# Patient Record
Sex: Male | Born: 1950 | Race: White | Hispanic: No | Smoking: Former smoker
Health system: Southern US, Community
[De-identification: ages and names within clinical notes are randomized; demographics above are authoritative.]

## PROBLEM LIST (undated history)

## (undated) DIAGNOSIS — I10 Essential (primary) hypertension: Secondary | ICD-10-CM

## (undated) DIAGNOSIS — I4891 Unspecified atrial fibrillation: Secondary | ICD-10-CM

## (undated) HISTORY — DX: Unspecified atrial fibrillation: I48.91

## (undated) HISTORY — DX: Essential (primary) hypertension: I10

## (undated) HISTORY — PX: WRIST SURGERY: SHX841

---

## 2009-11-08 ENCOUNTER — Ambulatory Visit: Payer: Self-pay | Admitting: Gastroenterology

## 2019-02-11 DIAGNOSIS — Z0000 Encounter for general adult medical examination without abnormal findings: Secondary | ICD-10-CM | POA: Insufficient documentation

## 2019-06-17 DIAGNOSIS — I420 Dilated cardiomyopathy: Secondary | ICD-10-CM | POA: Insufficient documentation

## 2019-10-19 ENCOUNTER — Telehealth (INDEPENDENT_AMBULATORY_CARE_PROVIDER_SITE_OTHER): Payer: Self-pay

## 2019-10-19 ENCOUNTER — Encounter (INDEPENDENT_AMBULATORY_CARE_PROVIDER_SITE_OTHER): Payer: Self-pay | Admitting: Vascular Surgery

## 2019-10-19 ENCOUNTER — Ambulatory Visit (INDEPENDENT_AMBULATORY_CARE_PROVIDER_SITE_OTHER): Payer: Medicare HMO | Admitting: Vascular Surgery

## 2019-10-19 ENCOUNTER — Encounter (INDEPENDENT_AMBULATORY_CARE_PROVIDER_SITE_OTHER): Payer: Self-pay

## 2019-10-19 ENCOUNTER — Other Ambulatory Visit: Payer: Self-pay

## 2019-10-19 DIAGNOSIS — I10 Essential (primary) hypertension: Secondary | ICD-10-CM

## 2019-10-19 DIAGNOSIS — I4891 Unspecified atrial fibrillation: Secondary | ICD-10-CM

## 2019-10-19 DIAGNOSIS — I70261 Atherosclerosis of native arteries of extremities with gangrene, right leg: Secondary | ICD-10-CM

## 2019-10-19 DIAGNOSIS — I70269 Atherosclerosis of native arteries of extremities with gangrene, unspecified extremity: Secondary | ICD-10-CM | POA: Insufficient documentation

## 2019-10-19 NOTE — Assessment & Plan Note (Signed)
Remains on anticoagulation.  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  Angiogram scheduled for the near future.

## 2019-10-19 NOTE — Patient Instructions (Signed)
Peripheral Vascular Disease  Peripheral vascular disease (PVD) is a disease of the blood vessels that are not part of your heart and brain. A simple term for PVD is poor circulation. In most cases, PVD narrows the blood vessels that carry blood from your heart to the rest of your body. This can reduce the supply of blood to your arms, legs, and internal organs, like your stomach or kidneys. However, PVD most often affects a person's lower legs and feet. Without treatment, PVD tends to get worse. PVD can also lead to acute ischemic limb. This is when an arm or leg suddenly cannot get enough blood. This is a medical emergency. Follow these instructions at home: Lifestyle  Do not use any products that contain nicotine or tobacco, such as cigarettes and e-cigarettes. If you need help quitting, ask your doctor.  Lose weight if you are overweight. Or, stay at a healthy weight as told by your doctor.  Eat a diet that is low in fat and cholesterol. If you need help, ask your doctor.  Exercise regularly. Ask your doctor for activities that are right for you. General instructions  Take over-the-counter and prescription medicines only as told by your doctor.  Take good care of your feet: ? Wear comfortable shoes that fit well. ? Check your feet often for any cuts or sores.  Keep all follow-up visits as told by your doctor This is important. Contact a doctor if:  You have cramps in your legs when you walk.  You have leg pain when you are at rest.  You have coldness in a leg or foot.  Your skin changes.  You are unable to get or have an erection (erectile dysfunction).  You have cuts or sores on your feet that do not heal. Get help right away if:  Your arm or leg turns cold, numb, and blue.  Your arms or legs become red, warm, swollen, painful, or numb.  You have chest pain.  You have trouble breathing.  You suddenly have weakness in your face, arm, or leg.  You become very  confused or you cannot speak.  You suddenly have a very bad headache.  You suddenly cannot see. Summary  Peripheral vascular disease (PVD) is a disease of the blood vessels.  A simple term for PVD is poor circulation. Without treatment, PVD tends to get worse.  Treatment may include exercise, low fat and low cholesterol diet, and quitting smoking. This information is not intended to replace advice given to you by your health care provider. Make sure you discuss any questions you have with your health care provider. Document Released: 01/15/2010 Document Revised: 10/03/2017 Document Reviewed: 11/28/2016 Elsevier Patient Education  2020 [employer]  

## 2019-10-19 NOTE — Telephone Encounter (Signed)
Patient was seen today and is now scheduled for a right leg angio with Dr. Lucky Cowboy on 10/25/2019 with a 9:00 am arrival time to the [facility]. Patient will do covid testing on 10/21/2019 between 12:30-2:30 pm at the [facility]. Pre-procedure instructions were discussed and a copy of instructions given to the patient.

## 2019-10-19 NOTE — Progress Notes (Signed)
Patient ID: Darrell Collier, male   DOB: 11/05/1950, 68 y.o.   MRN: [id number]  Chief Complaint  Patient presents with  . New Patient (Initial Visit)    ref Darrell Collier ischemic rt 2nd toe    HPI Darrell Collier is a 68 y.o. male.  I am asked to see the patient by Dr. Hyacinth Collier for evaluation of a gangrenous right second toe.  The patient began to notice discoloration of this toe over the past couple of weeks.  It has been painful.  There is no trauma or injury to the toe.  He thought maybe his shoes were a little tight initially, but even with loosened shoes the area has gotten worse.  No previous ulceration or infection to either lower extremity.  He does describe some claudication symptoms in the leg.  No clear inciting event or causative factor.  Nothing is made it better.  No fevers or chills.  No signs of systemic infection.  He does have history of atrial fibrillation and dilated cardiomyopathy and has been on anticoagulation which he continues.     Past Medical History:  Diagnosis Date  . A-fib (HCC)   . Hypertension      Family History  Problem Relation Age of Onset  . COPD Mother   . Breast cancer Mother   . Macular degeneration Father   No bleeding or clotting disorders   Social History   Tobacco Use  . Smoking status: Former Games developer  . Smokeless tobacco: Never Used  Substance Use Topics  . Alcohol use: Yes    Comment: ocassionally  . Drug use: Never    No Known Allergies  Current Outpatient Medications  Medication Sig Dispense Refill  . apixaban (ELIQUIS) 5 MG TABS tablet Take by mouth.    . diphenhydrAMINE (BENADRYL) 50 MG tablet Take by mouth.    . metoprolol succinate (TOPROL-XL) 50 MG 24 hr tablet Take by mouth.    . olmesartan-hydrochlorothiazide (BENICAR HCT) 20-12.5 MG tablet Take by mouth.     No current facility-administered medications for this visit.      REVIEW OF SYSTEMS (Negative unless checked)  Constitutional: [] Weight loss  [] Fever   [] Chills Cardiac: [] Chest pain   [] Chest pressure   [] Palpitations   [] Shortness of breath when laying flat   [] Shortness of breath at rest   [] Shortness of breath with exertion. Vascular:  [] Pain in legs with walking   [] Pain in legs at rest   [] Pain in legs when laying flat   [] Claudication   [x] Pain in feet when walking  [x] Pain in feet at rest  [] Pain in feet when laying flat   [] History of DVT   [] Phlebitis   [] Swelling in legs   [] Varicose veins   [x] Non-healing ulcers Pulmonary:   [] Uses home oxygen   [] Productive cough   [] Hemoptysis   [] Wheeze  [] COPD   [] Asthma Neurologic:  [] Dizziness  [] Blackouts   [] Seizures   [] History of stroke   [] History of TIA  [] Aphasia   [] Temporary blindness   [] Dysphagia   [] Weakness or numbness in arms   [] Weakness or numbness in legs Musculoskeletal:  [x] Arthritis   [] Joint swelling   [x] Joint pain   [] Low back pain Hematologic:  [] Easy bruising  [] Easy bleeding   [] Hypercoagulable state   [] Anemic  [] Hepatitis Gastrointestinal:  [] Blood in stool   [] Vomiting blood  [] Gastroesophageal reflux/heartburn   [] Abdominal pain Genitourinary:  [] Chronic kidney disease   [] Difficult urination  [] Frequent urination  [] Burning with  urination   [] Hematuria Skin:  [] Rashes   [x] Ulcers   [x] Wounds Psychological:  [] History of anxiety   []  History of major depression.    Physical Exam BP 102/74 (BP Location: Right Arm)   Pulse 92   Resp 16   Wt 210 lb (95.3 kg)  Gen:  WD/WN, NAD.  Appears older than stated age Head: [place]/AT, No temporalis wasting Ear/Nose/Throat: Hearing grossly intact, nares w/o erythema or drainage, oropharynx w/o Erythema/Exudate Eyes: Conjunctiva clear, sclera non-icteric  Neck: trachea midline.  No JVD.  Pulmonary:  Good air movement, respirations not labored, no use of accessory muscles  Cardiac: Irregular Vascular:  Vessel Right Left  Radial Palpable Palpable                          DP  trace  1+  PT  not palpable  not palpable    Gastrointestinal:. No masses, surgical incisions, or scars. Musculoskeletal: M/S 5/5 throughout.  Tip of the right second toe with dark discoloration and early dry gangrenous changes.  Right foot is somewhat cool to the touch and capillary refill was 3 to 4 seconds.  Left foot capillary refill was 2 to 3 seconds.  No ulcerations on the left leg. Neurologic: Sensation grossly intact in extremities.  Symmetrical.  Speech is fluent. Motor exam as listed above. Psychiatric: Judgment intact, Mood & affect appropriate for pt's clinical situation. Dermatologic: No rashes or ulcers noted.  No cellulitis or open wounds.    Radiology No results found.  Labs No results found for this or any previous visit (from the past 2160 hour(s)).  Assessment/Plan:  Atrial fibrillation (HCC) Remains on anticoagulation.  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  Angiogram scheduled for the near future.  Essential hypertension blood pressure control important in reducing the progression of atherosclerotic disease. On appropriate oral medications.   Atherosclerosis of native arteries of the extremities with gangrene [facility]) The patient has evidence of dry gangrenous changes to the right second toe.  We discussed the pathophysiology and natural history of peripheral arterial disease.  We discussed gangrene and why it is a limb threatening situation.  The patient has evidence of severe atherosclerotic changes of both lower extremities associated with ulceration and tissue loss of the foot on the right.  This represents a limb threatening ischemia and places the patient at the risk for limb loss.  Patient should undergo angiography of the lower extremities with the hope for intervention for limb salvage.  The risks and benefits as well as the alternative therapies was discussed in detail with the patient.  All questions  were answered.  Patient agrees to proceed with angiography.  The patient will follow up with me in the office after the procedure.        Leotis Pain 10/19/2019, 2:20 PM   This note was created with Dragon medical transcription system.  Any errors from dictation are unintentional.

## 2019-10-19 NOTE — Assessment & Plan Note (Signed)
blood pressure control important in reducing the progression of atherosclerotic disease. On appropriate oral medications.  

## 2019-10-19 NOTE — Assessment & Plan Note (Signed)
The patient has evidence of dry gangrenous changes to the right second toe.  We discussed the pathophysiology and natural history of peripheral arterial disease.  We discussed gangrene and why it is a limb threatening situation.  The patient has evidence of severe atherosclerotic changes of both lower extremities associated with ulceration and tissue loss of the foot on the right.  This represents a limb threatening ischemia and places the patient at the risk for limb loss.  Patient should undergo angiography of the lower extremities with the hope for intervention for limb salvage.  The risks and benefits as well as the alternative therapies was discussed in detail with the patient.  All questions were answered.  Patient agrees to proceed with angiography.  The patient will follow up with me in the office after the procedure.

## 2019-10-21 ENCOUNTER — Other Ambulatory Visit: Payer: Self-pay

## 2019-10-21 ENCOUNTER — Other Ambulatory Visit
Admission: RE | Admit: 2019-10-21 | Discharge: 2019-10-21 | Disposition: A | Discharge status: Home / Self Care | Payer: Medicare HMO | Source: Ambulatory Visit | Attending: Vascular Surgery | Admitting: Vascular Surgery

## 2019-10-21 DIAGNOSIS — Z20828 Contact with and (suspected) exposure to other viral communicable diseases: Secondary | ICD-10-CM | POA: Insufficient documentation

## 2019-10-21 DIAGNOSIS — Z01812 Encounter for preprocedural laboratory examination: Secondary | ICD-10-CM | POA: Insufficient documentation

## 2019-10-21 LAB — SARS CORONAVIRUS 2 (TAT 6-24 HRS): SARS Coronavirus 2: NEGATIVE

## 2019-10-25 ENCOUNTER — Other Ambulatory Visit (INDEPENDENT_AMBULATORY_CARE_PROVIDER_SITE_OTHER): Payer: Self-pay | Admitting: Nurse Practitioner

## 2019-10-25 ENCOUNTER — Other Ambulatory Visit: Payer: Self-pay

## 2019-10-25 ENCOUNTER — Ambulatory Visit
Admission: RE | Admit: 2019-10-25 | Discharge: 2019-10-25 | Disposition: A | Discharge status: Home / Self Care | Payer: Medicare HMO | Attending: Vascular Surgery | Admitting: Vascular Surgery

## 2019-10-25 ENCOUNTER — Encounter
Admission: RE | Disposition: A | Discharge status: Home / Self Care | Payer: Self-pay | Source: Home / Self Care | Attending: Vascular Surgery

## 2019-10-25 ENCOUNTER — Encounter: Payer: Self-pay | Admitting: Vascular Surgery

## 2019-10-25 DIAGNOSIS — Z79899 Other long term (current) drug therapy: Secondary | ICD-10-CM | POA: Diagnosis not present

## 2019-10-25 DIAGNOSIS — I70263 Atherosclerosis of native arteries of extremities with gangrene, bilateral legs: Secondary | ICD-10-CM | POA: Diagnosis not present

## 2019-10-25 DIAGNOSIS — Z7901 Long term (current) use of anticoagulants: Secondary | ICD-10-CM | POA: Diagnosis not present

## 2019-10-25 DIAGNOSIS — Z87891 Personal history of nicotine dependence: Secondary | ICD-10-CM | POA: Diagnosis not present

## 2019-10-25 DIAGNOSIS — I739 Peripheral vascular disease, unspecified: Secondary | ICD-10-CM

## 2019-10-25 DIAGNOSIS — I4891 Unspecified atrial fibrillation: Secondary | ICD-10-CM | POA: Diagnosis not present

## 2019-10-25 DIAGNOSIS — I10 Essential (primary) hypertension: Secondary | ICD-10-CM | POA: Insufficient documentation

## 2019-10-25 HISTORY — PX: LOWER EXTREMITY ANGIOGRAPHY: CATH118251

## 2019-10-25 LAB — CREATININE, SERUM
Creatinine, Ser: 2.01 mg/dL — ABNORMAL HIGH (ref 0.61–1.24)
GFR calc Af Amer: 39 mL/min — ABNORMAL LOW (ref >60)
GFR calc non Af Amer: 34 mL/min — ABNORMAL LOW (ref >60)

## 2019-10-25 LAB — BUN: BUN: 36 mg/dL — ABNORMAL HIGH (ref 8–23)

## 2019-10-25 SURGERY — LOWER EXTREMITY ANGIOGRAPHY
Anesthesia: Moderate Sedation | Site: Leg Lower | Laterality: Right

## 2019-10-25 MED ORDER — ATORVASTATIN CALCIUM 10 MG PO TABS: 10.0000 mg | ORAL_TABLET | Freq: Every day | ORAL | 11 refills | Status: DC

## 2019-10-25 MED ORDER — FAMOTIDINE 20 MG PO TABS: 40.0000 mg | ORAL_TABLET | Freq: Once | ORAL | Status: DC | PRN

## 2019-10-25 MED ORDER — HYDROMORPHONE HCL 1 MG/ML IJ SOLN: 1.0000 mg | Freq: Once | INTRAMUSCULAR | Status: DC | PRN

## 2019-10-25 MED ORDER — CEFAZOLIN SODIUM-DEXTROSE 2-4 GM/100ML-% IV SOLN: 2.0000 g | Freq: Once | INTRAVENOUS | Status: AC

## 2019-10-25 MED ORDER — DIPHENHYDRAMINE HCL 50 MG/ML IJ SOLN: 50.0000 mg | Freq: Once | INTRAMUSCULAR | Status: DC | PRN

## 2019-10-25 MED ORDER — ASPIRIN EC 81 MG PO TBEC: 81.0000 mg | DELAYED_RELEASE_TABLET | Freq: Every day | ORAL | 2 refills | Status: DC

## 2019-10-25 MED ORDER — CEFAZOLIN SODIUM-DEXTROSE 2-4 GM/100ML-% IV SOLN
INTRAVENOUS | Status: AC
  Administered 2019-10-25: 2 g via INTRAVENOUS
  Filled 2019-10-25: qty 100

## 2019-10-25 MED ORDER — SODIUM CHLORIDE 0.9 % IV SOLN: INTRAVENOUS | Status: DC

## 2019-10-25 MED ORDER — MIDAZOLAM HCL 2 MG/2ML IJ SOLN
INTRAMUSCULAR | Status: DC | PRN
  Administered 2019-10-25: 2 mg via INTRAVENOUS

## 2019-10-25 MED ORDER — IODIXANOL 320 MG/ML IV SOLN
INTRAVENOUS | Status: DC | PRN
  Administered 2019-10-25: 90 mL via INTRA_ARTERIAL

## 2019-10-25 MED ORDER — MIDAZOLAM HCL 5 MG/5ML IJ SOLN
INTRAMUSCULAR | Status: AC
  Filled 2019-10-25: qty 5

## 2019-10-25 MED ORDER — ATORVASTATIN CALCIUM 10 MG PO TABS: 10.0000 mg | ORAL_TABLET | Freq: Every day | ORAL | Status: DC

## 2019-10-25 MED ORDER — METHYLPREDNISOLONE SODIUM SUCC 125 MG IJ SOLR: 125.0000 mg | Freq: Once | INTRAMUSCULAR | Status: DC | PRN

## 2019-10-25 MED ORDER — ASPIRIN EC 81 MG PO TBEC
DELAYED_RELEASE_TABLET | ORAL | Status: AC
  Filled 2019-10-25: qty 1

## 2019-10-25 MED ORDER — ONDANSETRON HCL 4 MG/2ML IJ SOLN: 4.0000 mg | Freq: Four times a day (QID) | INTRAMUSCULAR | Status: DC | PRN

## 2019-10-25 MED ORDER — SODIUM CHLORIDE 0.9 % IV SOLN: 250.0000 mL | INTRAVENOUS | Status: DC | PRN

## 2019-10-25 MED ORDER — SODIUM CHLORIDE 0.9 % IV BOLUS
INTRAVENOUS | Status: AC | PRN
  Administered 2019-10-25: 250 mL via INTRAVENOUS

## 2019-10-25 MED ORDER — LABETALOL HCL 5 MG/ML IV SOLN: 10.0000 mg | INTRAVENOUS | Status: DC | PRN

## 2019-10-25 MED ORDER — FENTANYL CITRATE (PF) 100 MCG/2ML IJ SOLN
INTRAMUSCULAR | Status: DC | PRN
  Administered 2019-10-25: 50 ug via INTRAVENOUS

## 2019-10-25 MED ORDER — FENTANYL CITRATE (PF) 100 MCG/2ML IJ SOLN
INTRAMUSCULAR | Status: AC
  Filled 2019-10-25: qty 2

## 2019-10-25 MED ORDER — ACETAMINOPHEN 325 MG PO TABS: 650.0000 mg | ORAL_TABLET | ORAL | Status: DC | PRN

## 2019-10-25 MED ORDER — HEPARIN SODIUM (PORCINE) 1000 UNIT/ML IJ SOLN
INTRAMUSCULAR | Status: AC
  Filled 2019-10-25: qty 1

## 2019-10-25 MED ORDER — ASPIRIN EC 81 MG PO TBEC: 81.0000 mg | DELAYED_RELEASE_TABLET | Freq: Every day | ORAL | Status: DC

## 2019-10-25 MED ORDER — HEPARIN SODIUM (PORCINE) 1000 UNIT/ML IJ SOLN
INTRAMUSCULAR | Status: DC | PRN
  Administered 2019-10-25: 5000 [IU] via INTRAVENOUS

## 2019-10-25 MED ORDER — SODIUM CHLORIDE 0.9% FLUSH: 3.0000 mL | INTRAVENOUS | Status: DC | PRN

## 2019-10-25 MED ORDER — MIDAZOLAM HCL 2 MG/ML PO SYRP: 8.0000 mg | ORAL_SOLUTION | Freq: Once | ORAL | Status: DC | PRN

## 2019-10-25 MED ORDER — SODIUM CHLORIDE 0.9% FLUSH: 3.0000 mL | Freq: Two times a day (BID) | INTRAVENOUS | Status: DC

## 2019-10-25 MED ORDER — HYDRALAZINE HCL 20 MG/ML IJ SOLN: 5.0000 mg | INTRAMUSCULAR | Status: DC | PRN

## 2019-10-25 SURGICAL SUPPLY — 15 items
BALLN MUSTANG 10.0X40 75 (BALLOONS) ×2
BALLOON MUSTANG 10.0X40 75 (BALLOONS) ×1 IMPLANT
CATH PIG 70CM (CATHETERS) ×2 IMPLANT
DEVICE PRESTO INFLATION (MISCELLANEOUS) ×4 IMPLANT
DEVICE STARCLOSE SE CLOSURE (Vascular Products) ×4 IMPLANT
GLIDEWIRE ADV .035X260CM (WIRE) ×2 IMPLANT
PACK ANGIOGRAPHY (CUSTOM PROCEDURE TRAY) ×2 IMPLANT
SHEATH BRITE TIP 5FRX11 (SHEATH) ×2 IMPLANT
SHEATH BRITE TIP 6FRX11 (SHEATH) ×2 IMPLANT
SHEATH BRITE TIP 8FRX11 (SHEATH) ×2 IMPLANT
STENT LIFESTREAM 12X58X80 (Permanent Stent) ×2 IMPLANT
SYR MEDRAD MARK 7 150ML (SYRINGE) ×2 IMPLANT
TUBING CONTRAST HIGH PRESS 72 (TUBING) ×2 IMPLANT
WIRE J 3MM .035X145CM (WIRE) ×2 IMPLANT
WIRE MAGIC TOR.035 180C (WIRE) ×2 IMPLANT

## 2019-10-25 NOTE — Progress Notes (Signed)
Dr. Lucky Cowboy at bedside, speaking with pt. Re: procedural results. Pt. Verbalized understanding of conversation and need for extended bedrest.

## 2019-10-25 NOTE — Progress Notes (Signed)
Pt. Assisted OOB, ambulated in hallway, then back to room. Bilat. Groin sites clean, dry, intact, without hematoma, edema, drainage, ecchymosis. Denies any cardiac or subjective c/o. Stable for DC home. Pt. Father to pick pt. Up and take him home for evening.

## 2019-10-25 NOTE — Op Note (Signed)
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  Ultrasound guidance for vascular access bilateral femoral arteries             2.  Catheter placement into right common femoral artery from left femoral approach             3.  Aortogram and selective right lower extremity angiogram             4.  Percutaneous transluminal angioplasty of left common iliac artery with 10 mm diameter by 4 cm length balloon to protect the left iliac artery while stenting the right             5.   Covered stent placement to the right common iliac artery for treatment of pseudoaneurysm with 12 mm diameter by 58 mm length lifestream stent  6.  StarClose closure device bilateral femoral arteries  EBL: 15 cc  Contrast: 90 cc  Fluoro Time: 5.9 minutes  Moderate Conscious Sedation Time: approximately 40 minutes using 2 mg of Versed and 50 mcg of Fentanyl              Indications:  Patient is a 68 y.o.male with gangrenous changes to the tip of the right second toe. The patient is brought in for angiography for further evaluation and potential treatment.  Due to the limb threatening nature of the situation, angiogram was performed for attempted limb salvage. The patient is aware that if the procedure fails, amputation would be expected.  The patient also understands that even with successful revascularization, amputation may still be required due to the severity of the situation.  Risks and benefits are discussed and informed consent is obtained.   Procedure:  The patient was identified and appropriate procedural time out was performed.  The patient was then placed  supine on the table and prepped and draped in the usual sterile fashion. Moderate conscious sedation was administered during a face to face encounter with the patient throughout the procedure with my supervision of the RN administering medicines and monitoring the patient's vital signs, pulse oximetry, telemetry and mental status throughout from the start of the procedure until the patient was taken to the recovery room. Ultrasound was used to evaluate the left common femoral artery.  It was patent .  A digital ultrasound image was acquired.  A Seldinger needle was used to access the left common femoral artery under direct ultrasound guidance and a permanent image was performed.  A 0.035 J wire was advanced without resistance and a 5Fr sheath was placed.  Pigtail catheter was placed into the aorta and an AP aortogram was performed. This demonstrated normal renal arteries.  The aorta was normal.  The iliac artery on the left was generous with some mild irregularity proximally but in some slight ectasia but no frank aneurysm.  The right common iliac artery was highly irregular in the mid segment with what appeared to be a penetrating ulcer which had developed into a pseudoaneurysm.  The external iliac artery and hypogastric arteries were normal on the right. I then crossed the aortic bifurcation and advanced to the right femoral head. Selective right lower  extremity angiogram was then performed. This demonstrated normal right common femoral artery, profunda femoris artery, superficial femoral artery, and popliteal artery.  There was then two-vessel runoff to the foot through both the posterior tibial artery which was dominant in the anterior tibial artery which was pretty good although it did become more diminutive in the foot.  The cause of his gangrene was not from peripheral artery disease causing blockage but rather from embolization from the right iliac artery after seeing these images. It was felt that it was in  the patient's best interest to proceed with intervention after these images to avoid a second procedure and a larger amount of contrast and fluoroscopy based off of the findings from the initial angiogram.  The left side we upsized to a 6 French sheath to balloon the left common iliac artery protect it while we were performing the stent on the right.  This was essentially a kissing balloon fashion.  The right femoral artery was visualized with ultrasound and accessed under direct ultrasound guidance in the mid right common femoral artery where was widely patent.  A permanent image was recorded.  An 8 French sheath was placed on the right.  The patient was systemically heparinized. I then put a Magic torque wire into the aorta from the right femoral approach and we already had an advantage wire that we parked in the aorta from the left femoral approach.  A 10 mm diameter by 4 cm length balloon was put up the left side and parked in the left common iliac artery.  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  These were inflated simultaneously and a kissing balloon fashion up to 8 to 10 atm on each side.  Completion imaging after the pigtail catheter was placed on the left side showed the stent to be widely patent on the right with complete exclusion of the pseudoaneurysm and brisk flow and no significant residual stenosis.  On the left side where angioplasty was done to protect the left common iliac artery there was no significant stenosis after angioplasty.  I elected to terminate the procedure. The sheath was removed and StarClose closure device was deployed in the right femoral artery with excellent hemostatic result.  Similarly, StarClose closure device was deployed on the left with excellent hemostatic result.  The patient was taken to the recovery room in stable condition having tolerated the procedure  well.  Findings:               Aortogram:  Normal renal arteries.  The aorta was normal.  The iliac artery on the left was generous with some mild irregularity proximally but in some slight ectasia but no frank aneurysm.  The right common iliac artery was highly irregular in the mid segment with what appeared to be a penetrating ulcer which had developed into a pseudoaneurysm.  The external iliac artery and hypogastric arteries were normal on the right.             Right lower Extremity:  normal right common femoral artery, profunda femoris artery, superficial femoral artery, and popliteal artery.  There was then two-vessel runoff to the foot through both the posterior tibial artery which was dominant in the anterior tibial artery which was pretty good although it did become more diminutive in the foot.    Disposition: Patient was taken to the recovery room in stable condition  having tolerated the procedure well.  Complications: None  Leotis Pain 10/25/2019 10:53 AM   This note was created with Dragon Medical transcription system. Any errors in dictation are purely unintentional.

## 2019-10-25 NOTE — H&P (Signed)
[facility] History & Physical Update  The patient was interviewed and re-examined.  The patient's previous History and Physical has been reviewed and is unchanged.  There is no change in the plan of care. We plan to proceed with the scheduled procedure.  Leotis Pain, MD  10/25/2019, 8:16 AM

## 2019-11-15 ENCOUNTER — Other Ambulatory Visit (INDEPENDENT_AMBULATORY_CARE_PROVIDER_SITE_OTHER): Payer: Self-pay | Admitting: Vascular Surgery

## 2019-11-15 DIAGNOSIS — Z95820 Peripheral vascular angioplasty status with implants and grafts: Secondary | ICD-10-CM

## 2019-11-15 DIAGNOSIS — I708 Atherosclerosis of other arteries: Secondary | ICD-10-CM

## 2019-11-16 ENCOUNTER — Other Ambulatory Visit: Payer: Self-pay

## 2019-11-16 ENCOUNTER — Encounter (INDEPENDENT_AMBULATORY_CARE_PROVIDER_SITE_OTHER): Payer: Self-pay | Admitting: Vascular Surgery

## 2019-11-16 ENCOUNTER — Ambulatory Visit (INDEPENDENT_AMBULATORY_CARE_PROVIDER_SITE_OTHER): Payer: Medicare HMO

## 2019-11-16 ENCOUNTER — Ambulatory Visit (INDEPENDENT_AMBULATORY_CARE_PROVIDER_SITE_OTHER): Payer: Medicare HMO | Admitting: Vascular Surgery

## 2019-11-16 VITALS — BP 144/106 | HR 79 | Resp 16 | Wt 218.0 lb

## 2019-11-16 DIAGNOSIS — I70261 Atherosclerosis of native arteries of extremities with gangrene, right leg: Secondary | ICD-10-CM

## 2019-11-16 DIAGNOSIS — I708 Atherosclerosis of other arteries: Secondary | ICD-10-CM

## 2019-11-16 DIAGNOSIS — Z95820 Peripheral vascular angioplasty status with implants and grafts: Secondary | ICD-10-CM

## 2019-11-16 DIAGNOSIS — I4891 Unspecified atrial fibrillation: Secondary | ICD-10-CM

## 2019-11-16 DIAGNOSIS — I10 Essential (primary) hypertension: Secondary | ICD-10-CM

## 2019-11-16 NOTE — Assessment & Plan Note (Signed)
His ABIs today are normal although his digit pressure on the right is still markedly reduced.  He will continue on his anticoagulant and statin medication.  This should slowly improve over time and he may slough the tip of the right second toe but I do not think he will need it surgically removed.  He does have some toenail loss of the right great toe.  I have discussed it may be helpful for him to see a podiatrist.  I will see him back in 3 months
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
## 2020-02-15 ENCOUNTER — Encounter (INDEPENDENT_AMBULATORY_CARE_PROVIDER_SITE_OTHER): Payer: Medicare HMO

## 2020-02-15 ENCOUNTER — Ambulatory Visit (INDEPENDENT_AMBULATORY_CARE_PROVIDER_SITE_OTHER): Payer: Medicare HMO | Admitting: Vascular Surgery

## 2020-02-25 ENCOUNTER — Other Ambulatory Visit: Payer: Self-pay

## 2020-02-25 ENCOUNTER — Ambulatory Visit (INDEPENDENT_AMBULATORY_CARE_PROVIDER_SITE_OTHER): Payer: Medicare HMO | Admitting: Vascular Surgery

## 2020-02-25 ENCOUNTER — Ambulatory Visit (INDEPENDENT_AMBULATORY_CARE_PROVIDER_SITE_OTHER): Payer: Medicare HMO

## 2020-02-25 ENCOUNTER — Encounter (INDEPENDENT_AMBULATORY_CARE_PROVIDER_SITE_OTHER): Payer: Self-pay | Admitting: Vascular Surgery

## 2020-02-25 VITALS — BP 151/92 | HR 89 | Resp 16 | Wt 213.8 lb

## 2020-02-25 DIAGNOSIS — I4891 Unspecified atrial fibrillation: Secondary | ICD-10-CM | POA: Diagnosis not present

## 2020-02-25 DIAGNOSIS — I10 Essential (primary) hypertension: Secondary | ICD-10-CM | POA: Diagnosis not present

## 2020-02-25 DIAGNOSIS — I70261 Atherosclerosis of native arteries of extremities with gangrene, right leg: Secondary | ICD-10-CM | POA: Diagnosis not present
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
## 2020-02-25 NOTE — Assessment & Plan Note (Signed)
ABIs today are 1.29 on the right and 1.21 on the left.  Although the digital pressures still dampened on the right side it is significantly better than it was at last check and now measures 80.  The left digital pressures are normal.  Continue current medical regimen.  We can extend his follow-up to 6 months at this point.  Contact our office with any problems in the interim.

## 2020-02-25 NOTE — Patient Instructions (Signed)
Peripheral Vascular Disease  Peripheral vascular disease (PVD) is a disease of the blood vessels that are not part of your heart and brain. A simple term for PVD is poor circulation. In most cases, PVD narrows the blood vessels that carry blood from your heart to the rest of your body. This can reduce the supply of blood to your arms, legs, and internal organs, like your stomach or kidneys. However, PVD most often affects a person's lower legs and feet. Without treatment, PVD tends to get worse. PVD can also lead to acute ischemic limb. This is when an arm or leg suddenly cannot get enough blood. This is a medical emergency. Follow these instructions at home: Lifestyle  Do not use any products that contain nicotine or tobacco, such as cigarettes and e-cigarettes. If you need help quitting, ask your doctor.  Lose weight if you are overweight. Or, stay at a healthy weight as told by your doctor.  Eat a diet that is low in fat and cholesterol. If you need help, ask your doctor.  Exercise regularly. Ask your doctor for activities that are right for you. General instructions  Take over-the-counter and prescription medicines only as told by your doctor.  Take good care of your feet: ? Wear comfortable shoes that fit well. ? Check your feet often for any cuts or sores.  Keep all follow-up visits as told by your doctor This is important. Contact a doctor if:  You have cramps in your legs when you walk.  You have leg pain when you are at rest.  You have coldness in a leg or foot.  Your skin changes.  You are unable to get or have an erection (erectile dysfunction).  You have cuts or sores on your feet that do not heal. Get help right away if:  Your arm or leg turns cold, numb, and blue.  Your arms or legs become red, warm, swollen, painful, or numb.  You have chest pain.  You have trouble breathing.  You suddenly have weakness in your face, arm, or leg.  You become very  confused or you cannot speak.  You suddenly have a very bad headache.  You suddenly cannot see. Summary  Peripheral vascular disease (PVD) is a disease of the blood vessels.  A simple term for PVD is poor circulation. Without treatment, PVD tends to get worse.  Treatment may include exercise, low fat and low cholesterol diet, and quitting smoking. This information is not intended to replace advice given to you by your health care provider. Make sure you discuss any questions you have with your health care provider. Document Revised: 10/03/2017 Document Reviewed: 11/28/2016 Elsevier Patient Education  2020 Elsevier Inc.  

## 2020-03-14 ENCOUNTER — Ambulatory Visit: Payer: Medicare HMO | Admitting: Urology

## 2020-03-14 ENCOUNTER — Encounter (INDEPENDENT_AMBULATORY_CARE_PROVIDER_SITE_OTHER): Payer: Medicare HMO

## 2020-03-14 ENCOUNTER — Ambulatory Visit (INDEPENDENT_AMBULATORY_CARE_PROVIDER_SITE_OTHER): Payer: Medicare HMO | Admitting: Vascular Surgery

## 2020-08-25 ENCOUNTER — Ambulatory Visit (INDEPENDENT_AMBULATORY_CARE_PROVIDER_SITE_OTHER): Payer: Medicare HMO | Admitting: Vascular Surgery

## 2020-08-25 ENCOUNTER — Ambulatory Visit (INDEPENDENT_AMBULATORY_CARE_PROVIDER_SITE_OTHER): Payer: Medicare HMO

## 2020-08-25 ENCOUNTER — Other Ambulatory Visit: Payer: Self-pay

## 2020-08-25 ENCOUNTER — Encounter (INDEPENDENT_AMBULATORY_CARE_PROVIDER_SITE_OTHER): Payer: Self-pay | Admitting: Vascular Surgery

## 2020-08-25 VITALS — BP 172/124 | HR 114 | Resp 16 | Wt 212.0 lb

## 2020-08-25 DIAGNOSIS — I70261 Atherosclerosis of native arteries of extremities with gangrene, right leg: Secondary | ICD-10-CM

## 2020-08-25 DIAGNOSIS — I10 Essential (primary) hypertension: Secondary | ICD-10-CM | POA: Diagnosis not present

## 2020-08-25 DIAGNOSIS — I4891 Unspecified atrial fibrillation: Secondary | ICD-10-CM
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
## 2020-08-25 NOTE — Assessment & Plan Note (Signed)
Toes have healed and he has done well after repair of his right iliac artery pseudoaneurysm almost a year ago.  ABIs today are 1.08 on the right and 1.06 on the left with triphasic waveforms.  His digital pressures have normalized as well.  Overall he is doing well.  He is already on anticoagulation for his atrial fibrillation.  We can go to an annual follow-up at this point.

## 2020-11-27 ENCOUNTER — Telehealth (INDEPENDENT_AMBULATORY_CARE_PROVIDER_SITE_OTHER): Payer: Self-pay

## 2020-11-27 ENCOUNTER — Other Ambulatory Visit (INDEPENDENT_AMBULATORY_CARE_PROVIDER_SITE_OTHER): Payer: Self-pay | Admitting: Nurse Practitioner

## 2020-11-27 MED ORDER — ATORVASTATIN CALCIUM 10 MG PO TABS: 10.0000 mg | ORAL_TABLET | Freq: Every day | ORAL | 3 refills | Status: DC

## 2020-11-27 MED ORDER — APIXABAN 5 MG PO TABS: 5.0000 mg | ORAL_TABLET | Freq: Two times a day (BID) | ORAL | 11 refills | Status: DC

## 2020-11-27 NOTE — Telephone Encounter (Signed)
I made the pt aware of the Np's instructions.

## 2020-11-27 NOTE — Telephone Encounter (Signed)
Pt came into the office to pay his bill and also wanted to know could he get a refill on his Eliquis .

## 2020-11-27 NOTE — Telephone Encounter (Signed)
Refill sent.

## 2020-11-27 NOTE — Telephone Encounter (Signed)
Refill sent however further refills will need to be done by PCP as they routinely check his cholesterol

## 2020-11-27 NOTE — Telephone Encounter (Signed)
I called and made the pt aware if his eliquis refill and he says he also needs atorvastatin to be refilled.

## 2021-03-03 ENCOUNTER — Other Ambulatory Visit (INDEPENDENT_AMBULATORY_CARE_PROVIDER_SITE_OTHER): Payer: Self-pay | Admitting: Nurse Practitioner

## 2021-06-07 ENCOUNTER — Inpatient Hospital Stay: Payer: Medicare HMO

## 2021-06-07 ENCOUNTER — Other Ambulatory Visit: Payer: Self-pay

## 2021-06-07 ENCOUNTER — Emergency Department: Payer: Medicare HMO

## 2021-06-07 ENCOUNTER — Inpatient Hospital Stay
Admission: EM | Admit: 2021-06-07 | Discharge: 2021-06-14 | DRG: 065 | Disposition: A | Discharge status: Skilled Nursing Facility | Payer: Medicare HMO | Attending: Internal Medicine | Admitting: Internal Medicine

## 2021-06-07 ENCOUNTER — Inpatient Hospital Stay (HOSPITAL_COMMUNITY)
Admit: 2021-06-07 | Discharge: 2021-06-07 | Disposition: A | Discharge status: Home / Self Care | Payer: Medicare HMO | Attending: Internal Medicine | Admitting: Internal Medicine

## 2021-06-07 DIAGNOSIS — I10 Essential (primary) hypertension: Secondary | ICD-10-CM

## 2021-06-07 DIAGNOSIS — Z20822 Contact with and (suspected) exposure to covid-19: Secondary | ICD-10-CM | POA: Diagnosis present

## 2021-06-07 DIAGNOSIS — Z825 Family history of asthma and other chronic lower respiratory diseases: Secondary | ICD-10-CM

## 2021-06-07 DIAGNOSIS — Z803 Family history of malignant neoplasm of breast: Secondary | ICD-10-CM | POA: Diagnosis not present

## 2021-06-07 DIAGNOSIS — I4891 Unspecified atrial fibrillation: Secondary | ICD-10-CM

## 2021-06-07 DIAGNOSIS — I6349 Cerebral infarction due to embolism of other cerebral artery: Secondary | ICD-10-CM | POA: Diagnosis not present

## 2021-06-07 DIAGNOSIS — I63412 Cerebral infarction due to embolism of left middle cerebral artery: Secondary | ICD-10-CM | POA: Diagnosis present

## 2021-06-07 DIAGNOSIS — I639 Cerebral infarction, unspecified: Secondary | ICD-10-CM

## 2021-06-07 DIAGNOSIS — R310 Gross hematuria: Secondary | ICD-10-CM | POA: Diagnosis present

## 2021-06-07 DIAGNOSIS — I4819 Other persistent atrial fibrillation: Secondary | ICD-10-CM | POA: Diagnosis present

## 2021-06-07 DIAGNOSIS — E785 Hyperlipidemia, unspecified: Secondary | ICD-10-CM | POA: Diagnosis present

## 2021-06-07 DIAGNOSIS — Z79899 Other long term (current) drug therapy: Secondary | ICD-10-CM

## 2021-06-07 DIAGNOSIS — I420 Dilated cardiomyopathy: Secondary | ICD-10-CM | POA: Diagnosis present

## 2021-06-07 DIAGNOSIS — Z7901 Long term (current) use of anticoagulants: Secondary | ICD-10-CM | POA: Diagnosis not present

## 2021-06-07 DIAGNOSIS — N1832 Chronic kidney disease, stage 3b: Secondary | ICD-10-CM | POA: Diagnosis present

## 2021-06-07 DIAGNOSIS — I739 Peripheral vascular disease, unspecified: Secondary | ICD-10-CM | POA: Diagnosis present

## 2021-06-07 DIAGNOSIS — R4701 Aphasia: Secondary | ICD-10-CM | POA: Diagnosis present

## 2021-06-07 DIAGNOSIS — Z7982 Long term (current) use of aspirin: Secondary | ICD-10-CM

## 2021-06-07 DIAGNOSIS — F17220 Nicotine dependence, chewing tobacco, uncomplicated: Secondary | ICD-10-CM | POA: Diagnosis present

## 2021-06-07 DIAGNOSIS — R29706 NIHSS score 6: Secondary | ICD-10-CM | POA: Diagnosis present

## 2021-06-07 DIAGNOSIS — G9349 Other encephalopathy: Secondary | ICD-10-CM | POA: Diagnosis present

## 2021-06-07 DIAGNOSIS — I6389 Other cerebral infarction: Secondary | ICD-10-CM

## 2021-06-07 DIAGNOSIS — I129 Hypertensive chronic kidney disease with stage 1 through stage 4 chronic kidney disease, or unspecified chronic kidney disease: Secondary | ICD-10-CM | POA: Diagnosis present

## 2021-06-07 LAB — URINALYSIS, COMPLETE (UACMP) WITH MICROSCOPIC
Bilirubin Urine: NEGATIVE
Glucose, UA: NEGATIVE mg/dL
Ketones, ur: 20 mg/dL — AB
Leukocytes,Ua: NEGATIVE
Nitrite: NEGATIVE
Protein, ur: NEGATIVE mg/dL
Specific Gravity, Urine: 1.044 — ABNORMAL HIGH (ref 1.005–1.030)
pH: 5 (ref 5.0–8.0)

## 2021-06-07 LAB — CBC
HCT: 42.4 % (ref 39.0–52.0)
Hemoglobin: 14.4 g/dL (ref 13.0–17.0)
MCH: 30.7 pg (ref 26.0–34.0)
MCHC: 34 g/dL (ref 30.0–36.0)
MCV: 90.4 fL (ref 80.0–100.0)
Platelets: 178 10*3/uL (ref 150–400)
RBC: 4.69 MIL/uL (ref 4.22–5.81)
RDW: 14.9 % (ref 11.5–15.5)
WBC: 8.5 10*3/uL (ref 4.0–10.5)
nRBC: 0 % (ref 0.0–0.2)

## 2021-06-07 LAB — DIFFERENTIAL
Abs Immature Granulocytes: 0.03 10*3/uL (ref 0.00–0.07)
Basophils Absolute: 0.1 10*3/uL (ref 0.0–0.1)
Basophils Relative: 1 %
Eosinophils Absolute: 0.1 10*3/uL (ref 0.0–0.5)
Eosinophils Relative: 1 %
Immature Granulocytes: 0 %
Lymphocytes Relative: 25 %
Lymphs Abs: 2.1 10*3/uL (ref 0.7–4.0)
Monocytes Absolute: 0.7 10*3/uL (ref 0.1–1.0)
Monocytes Relative: 8 %
Neutro Abs: 5.5 10*3/uL (ref 1.7–7.7)
Neutrophils Relative %: 65 %

## 2021-06-07 LAB — COMPREHENSIVE METABOLIC PANEL
ALT: 12 U/L (ref 0–44)
AST: 21 U/L (ref 15–41)
Albumin: 3.8 g/dL (ref 3.5–5.0)
Alkaline Phosphatase: 89 U/L (ref 38–126)
Anion gap: 13 (ref 5–15)
BUN: 26 mg/dL — ABNORMAL HIGH (ref 8–23)
CO2: 23 mmol/L (ref 22–32)
Calcium: 9 mg/dL (ref 8.9–10.3)
Chloride: 104 mmol/L (ref 98–111)
Creatinine, Ser: 1.46 mg/dL — ABNORMAL HIGH (ref 0.61–1.24)
GFR, Estimated: 52 mL/min — ABNORMAL LOW (ref >60)
Glucose, Bld: 83 mg/dL (ref 70–99)
Potassium: 3.6 mmol/L (ref 3.5–5.1)
Sodium: 140 mmol/L (ref 135–145)
Total Bilirubin: 1.7 mg/dL — ABNORMAL HIGH (ref 0.3–1.2)
Total Protein: 7.7 g/dL (ref 6.5–8.1)

## 2021-06-07 LAB — CBG MONITORING, ED: Glucose-Capillary: 74 mg/dL (ref 70–99)

## 2021-06-07 LAB — RESP PANEL BY RT-PCR (FLU A&B, COVID) ARPGX2
Influenza A by PCR: NEGATIVE
Influenza B by PCR: NEGATIVE
SARS Coronavirus 2 by RT PCR: NEGATIVE

## 2021-06-07 LAB — ETHANOL: Alcohol, Ethyl (B): <10 mg/dL (ref <10)

## 2021-06-07 LAB — ECHOCARDIOGRAM COMPLETE BUBBLE STUDY
AR max vel: 1.95 cm2
AV Area VTI: 2.48 cm2
AV Area mean vel: 1.92 cm2
AV Mean grad: 3 mmHg
AV Peak grad: 4.5 mmHg
Ao pk vel: 1.06 m/s
Area-P 1/2: 5.09 cm2
S' Lateral: 2.53 cm

## 2021-06-07 LAB — APTT: aPTT: 29 seconds (ref 24–36)

## 2021-06-07 LAB — PROTIME-INR
INR: 1.1 (ref 0.8–1.2)
Prothrombin Time: 14.1 seconds (ref 11.4–15.2)

## 2021-06-07 LAB — MAGNESIUM: Magnesium: 1.7 mg/dL (ref 1.7–2.4)

## 2021-06-07 MED ORDER — SODIUM CHLORIDE 0.9% FLUSH
3.0000 mL | Freq: Once | INTRAVENOUS | Status: AC
Start: 2021-06-07 — End: 2021-06-07
  Administered 2021-06-07: 3 mL via INTRAVENOUS

## 2021-06-07 MED ORDER — METOPROLOL SUCCINATE ER 25 MG PO TB24
25.0000 mg | ORAL_TABLET | Freq: Two times a day (BID) | ORAL | Status: DC
  Administered 2021-06-07 (×2): 25 mg via ORAL
  Filled 2021-06-07 (×2): qty 1

## 2021-06-07 MED ORDER — ACETAMINOPHEN 160 MG/5ML PO SOLN
650.0000 mg | ORAL | Status: DC | PRN
  Filled 2021-06-07: qty 20.3

## 2021-06-07 MED ORDER — LABETALOL HCL 5 MG/ML IV SOLN
10.0000 mg | INTRAVENOUS | Status: DC | PRN
  Administered 2021-06-07: 10 mg via INTRAVENOUS
  Filled 2021-06-07: qty 4

## 2021-06-07 MED ORDER — DILTIAZEM LOAD VIA INFUSION
10.0000 mg | Freq: Once | INTRAVENOUS | Status: AC
  Administered 2021-06-07: 10 mg via INTRAVENOUS
  Filled 2021-06-07: qty 10

## 2021-06-07 MED ORDER — SENNOSIDES-DOCUSATE SODIUM 8.6-50 MG PO TABS: 1.0000 | ORAL_TABLET | Freq: Every evening | ORAL | Status: DC | PRN

## 2021-06-07 MED ORDER — ACETAMINOPHEN 650 MG RE SUPP: 650.0000 mg | RECTAL | Status: DC | PRN

## 2021-06-07 MED ORDER — DILTIAZEM HCL-DEXTROSE 125-5 MG/125ML-% IV SOLN (PREMIX)
5.0000 mg/h | INTRAVENOUS | Status: DC
  Administered 2021-06-07: 5 mg/h via INTRAVENOUS
  Filled 2021-06-07: qty 125

## 2021-06-07 MED ORDER — APIXABAN 5 MG PO TABS: 5.0000 mg | ORAL_TABLET | Freq: Two times a day (BID) | ORAL | Status: DC

## 2021-06-07 MED ORDER — SODIUM CHLORIDE 0.9 % IV SOLN: INTRAVENOUS | Status: DC

## 2021-06-07 MED ORDER — ATORVASTATIN CALCIUM 20 MG PO TABS
80.0000 mg | ORAL_TABLET | Freq: Every day | ORAL | Status: DC
  Administered 2021-06-07 – 2021-06-14 (×8): 80 mg via ORAL
  Filled 2021-06-07 (×8): qty 4

## 2021-06-07 MED ORDER — IOHEXOL 350 MG/ML SOLN
75.0000 mL | Freq: Once | INTRAVENOUS | Status: AC | PRN
  Administered 2021-06-07: 75 mL via INTRAVENOUS

## 2021-06-07 MED ORDER — LACTATED RINGERS IV BOLUS
500.0000 mL | Freq: Once | INTRAVENOUS | Status: AC
  Administered 2021-06-07: 500 mL via INTRAVENOUS

## 2021-06-07 MED ORDER — ASPIRIN EC 81 MG PO TBEC: 81.0000 mg | DELAYED_RELEASE_TABLET | Freq: Every day | ORAL | Status: DC

## 2021-06-07 MED ORDER — ASPIRIN 81 MG PO CHEW
324.0000 mg | CHEWABLE_TABLET | Freq: Once | ORAL | Status: DC
  Filled 2021-06-07: qty 4

## 2021-06-07 MED ORDER — STROKE: EARLY STAGES OF RECOVERY BOOK: Freq: Once | Status: AC

## 2021-06-07 MED ORDER — ACETAMINOPHEN 325 MG PO TABS: 650.0000 mg | ORAL_TABLET | ORAL | Status: DC | PRN

## 2021-06-07 MED ORDER — ASPIRIN EC 81 MG PO TBEC
81.0000 mg | DELAYED_RELEASE_TABLET | Freq: Every day | ORAL | Status: DC
  Administered 2021-06-08 – 2021-06-09 (×2): 81 mg via ORAL
  Filled 2021-06-07 (×2): qty 1
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
## 2021-06-07 NOTE — ED Notes (Signed)
MD Michiel Sites aware of BP 187/122. No new orders.

## 2021-06-07 NOTE — ED Notes (Signed)
Dr. Marylu Lund at bedside assessing pt at this time.

## 2021-06-07 NOTE — Progress Notes (Signed)
PT Cancellation Note  Patient Details Name: Darrell Collier MRN: [id number] DOB: 1951/05/07   Cancelled Treatment:    Reason Eval/Treat Not Completed: Patient not medically ready Consult received and chart reviewed. Patient noted with significantly high diastolic BP, elevated HR, per chart a-fib with RVR, and started on a Cardizem drip at 12:27 PM, per PT practice guidelines contraindicated for exertional activity at this time and recommended 24 hold following start of infusion.  Will continue efforts next date pending medical stability and appropriateness.    [employer], SPT

## 2021-06-07 NOTE — ED Notes (Signed)
Pt transported to CT via stretcher at this time.  
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
## 2021-06-07 NOTE — ED Notes (Signed)
Pts father at bedside. This Clinical research associate informed pts father to let nursing staff know if any needs arise. Santina Evans, RN notified.

## 2021-06-07 NOTE — Consult Note (Signed)
Neurology Consultation  Reason for Consult: Stroke, confusion Referring Physician: Dr. Marylu Lund, hospitalist  CC: Altered mental status  History is obtained from: Chart  HPI: Darrell Collier is a 70 y.o. male past medical history of dilated cardiomyopathy, hypertension, peripheral arterial disease, atrial fibrillation on anticoagulation unknown when his last dose of his Eliquis was, unable to reliably provide history for presentation of confusion and unable to make coherent sentences when evaluated at [facility] this morning.  I have not been able to reach any family member but I was told that his father contacted and said that he is usually coherent, alert and oriented x4 but was not making any sense.  He was last known well sometime Saturday, June 02, 2021. No further history is available. Patient is extremely frustrated-does not know why he is here or where he is right now. Her speech is very fluent but he is unable to follow any commands.  His sentences make sense but they are completely out of context to the questions in the conversation.   LKW: Unclear-possibly Saturday, 06/02/2021 tpa given?: no, on Eliquis, outside the window Premorbid modified Rankin scale (mRS): Presumably 0 based on report ROS: Unable to ascertain due to aphasia  Past Medical History:  Diagnosis Date   A-fib (HCC)    Hypertension    Family History  Problem Relation Age of Onset   COPD Mother    Breast cancer Mother    Macular degeneration Father     Social History:   reports that he quit smoking about 17 years ago. His smoking use included cigarettes. His smokeless tobacco use includes chew. He reports current alcohol use. He reports that he does not use drugs.  Medications  Current Facility-Administered Medications:     stroke: mapping our early stages of recovery book, , Does not apply, Once, Amery, Sahar, MD   0.9 %  sodium chloride infusion, , Intravenous, Continuous, Marylu Lund, Sahar, MD   acetaminophen  (TYLENOL) tablet 650 mg, 650 mg, Oral, Q4H PRN **OR** acetaminophen (TYLENOL) 160 MG/5ML solution 650 mg, 650 mg, Per Tube, Q4H PRN **OR** acetaminophen (TYLENOL) suppository 650 mg, 650 mg, Rectal, Q4H PRN, Lynn Ito, MD   apixaban (ELIQUIS) tablet 5 mg, 5 mg, Oral, BID, Lynn Ito, MD   aspirin chewable tablet 324 mg, 324 mg, Oral, Once, Gilles Chiquito, MD   [START ON 06/08/2021] aspirin EC tablet 81 mg, 81 mg, Oral, Daily, Marylu Lund, Sahar, MD   atorvastatin (LIPITOR) tablet 80 mg, 80 mg, Oral, Daily, Marylu Lund, Sahar, MD   diltiazem (CARDIZEM) 1 mg/mL load via infusion 10 mg, 10 mg, Intravenous, Once **AND** diltiazem (CARDIZEM) 125 mg in dextrose 5% 125 mL (1 mg/mL) infusion, 5-15 mg/hr, Intravenous, Continuous, Smith, Zerita Boers, MD   labetalol (NORMODYNE) injection 10 mg, 10 mg, Intravenous, Q2H PRN, Marylu Lund, Sahar, MD   senna-docusate (Senokot-S) tablet 1 tablet, 1 tablet, Oral, QHS PRN, Lynn Ito, MD  Current Outpatient Medications:    apixaban (ELIQUIS) 5 MG TABS tablet, Take 1 tablet (5 mg total) by mouth 2 (two) times daily., Disp: 60 tablet, Rfl: 11   aspirin EC 81 MG tablet, Take 1 tablet (81 mg total) by mouth daily., Disp: 150 tablet, Rfl: 2   atorvastatin (LIPITOR) 10 MG tablet, Take 1 tablet (10 mg total) by mouth daily., Disp: 30 tablet, Rfl: 3   diphenhydrAMINE (BENADRYL) 50 MG tablet, Take by mouth., Disp: , Rfl:    metoprolol succinate (TOPROL-XL) 50 MG 24 hr tablet, Take by mouth., Disp: , Rfl:  olmesartan-hydrochlorothiazide (BENICAR HCT) 20-12.5 MG tablet, Take by mouth. (Patient not taking: No sig reported), Disp: , Rfl:    Exam: Current vital signs: BP (!) 187/122   Pulse (!) 122   Temp 98.6 F (37 C) (Oral)   Resp 11   Ht 6\' 1"  (1.854 m)   Wt 86.2 kg   SpO2 96%   BMI 25.07 kg/m  Vital signs in last 24 hours: Temp:  [98.6 F (37 C)] 98.6 F (37 C) (08/04 0841) Pulse Rate:  [59-122] 122 (08/04 1002) Resp:  [11-18] 11 (08/04 1002) BP: (172-187)/(122-137)  187/122 (08/04 1002) SpO2:  [96 %-97 %] 96 % (08/04 1002) Weight:  [86.2 kg] 86.2 kg (08/04 0841) General awake alert appears frustrated with what ever is going on around him. HEENT: Normocephalic/atraumatic Lungs: Clear Cardiovascular: Irregular irregular and tachycardic Extremities warm well perfused Does not cooperate much with the exam Neurological exam Is awake alert Speech is fluent but his sentences make no sense in terms of the contex to the conversation. He is unable to name repeat or comprehend. Appears to have dense receptive aphasia. Not very cooperative with exam Does not appear to be in apparent distress Cranial nerve examination: Pupils appear round and reactive to light, he is able to track the examiner on both sides, does not appear to have a field cut based on blink to threat, face appears symmetric at rest but he is unable to cooperate with the exam to smile and let me examine him completely. Motor examination: He is standing up and raising his arms with apparently equal strength but does not cooperate with individual muscle testing-I did not appreciate any drift in any of the 4 extremities. Difficult to gauge sensation or coordination at this time due to lack of his cooperation. NIH stroke scale 1a Level of Conscious.: 0 1b LOC Questions: 2 1c LOC Commands: 2 2 Best Gaze: 0 3 Visual: 0 4 Facial Palsy: 0 5a Motor Arm - left: 0 5b Motor Arm - Right: 0 6a Motor Leg - Left: 0 6b Motor Leg - Right: 0 7 Limb Ataxia: 0 8 Sensory: 0 9 Best Language: 2 10 Dysarthria: 0 11 Extinct. and Inatten.: 0 TOTAL: 6   Labs I have reviewed labs in epic and the results pertinent to this consultation are:  CBC    Component Value Date/Time   WBC 8.5 06/07/2021 0845   RBC 4.69 06/07/2021 0845   HGB 14.4 06/07/2021 0845   HCT 42.4 06/07/2021 0845   PLT 178 06/07/2021 0845   MCV 90.4 06/07/2021 0845   MCH 30.7 06/07/2021 0845   MCHC 34.0 06/07/2021 0845   RDW 14.9  06/07/2021 0845   LYMPHSABS 2.1 06/07/2021 0845   MONOABS 0.7 06/07/2021 0845   EOSABS 0.1 06/07/2021 0845   BASOSABS 0.1 06/07/2021 0845    CMP     Component Value Date/Time   NA 140 06/07/2021 0845   K 3.6 06/07/2021 0845   CL 104 06/07/2021 0845   CO2 23 06/07/2021 0845   GLUCOSE 83 06/07/2021 0845   BUN 26 (H) 06/07/2021 0845   CREATININE 1.46 (H) 06/07/2021 0845   CALCIUM 9.0 06/07/2021 0845   PROT 7.7 06/07/2021 0845   ALBUMIN 3.8 06/07/2021 0845   AST 21 06/07/2021 0845   ALT 12 06/07/2021 0845   ALKPHOS 89 06/07/2021 0845   BILITOT 1.7 (H) 06/07/2021 0845   GFRNONAA 52 (L) 06/07/2021 0845   GFRAA 39 (L) 10/25/2019 0825    Imaging I have  reviewed the images obtained: CT head without contrast with subtle low-density is in the posterior left frontal lobe concerning for early acute infarction.  Old right basal ganglia lacunar infarctions.  Chronic small vessel disease.  CT head and neck with no emergent LVO  Assessment:  70 year old man with atrial fibrillation on anticoagulation and dilated cardiomyopathy along with hypertension in terms of his prior medical history presenting for evaluation of altered mental status. On examination, he has dense receptive aphasia-which I think makes him appear confused. I suspect cardioembolic appearing stroke based on history of atrial fibrillation. Unclear when he last took his Eliquis. Unclear last known well Due to the above reasons, not a candidate for tPA No emergent LVO on vascular imaging  Impression: Acute ischemic stroke-likely cardioembolic in nature Atrial fibrillation Dilated cardiomyopathy Peripheral vascular disease Encephalopathy-likely secondary to the aphasia due to the stroke  On apixaban and aspirin for A. fib and peripheral vascular disease.  Recommendations: At this time, I would recommend hospital admission Frequent neurochecks Telemetry Management of A. fib with RVR per primary team MRI brain  without contrast For now aspirin 81 should be fine-see eliquis recs below High intensity statin 2D echo A1c Lipid panel PT OT Speech therapy N.p.o. until cleared by bedside swallow screen No need for permissive hypertension-can start normalizing blood pressure with goal blood pressure at discharge less than 140/90.  Hold off on Eliquis for today, get imaging, and if the true last known well is 4 to 5 days ago, can then resume Eliquis. Can use heparin drip for Afib if needed - stroke protocol, no bolus.   Discussed my plan with Dr. Marylu Lund, hospitalist over securechat  -- Milon Dikes, MD Neurologist [facility] Pager: [phone]

## 2021-06-07 NOTE — ED Notes (Signed)
This Clinical research associate as Comptroller; pt lying in bed watching tv, no needs/concerns voiced at this time.

## 2021-06-07 NOTE — ED Notes (Signed)
Patient is calm and is watching TV.  Patient was able to eat meal without any difficulty and without any assistance.  Patient continues to wait for MRI.  MRI did bring IV pump states that not sure what time patient will be scanned, tech is aware patient will require  RN to go with patient. Bed low and locked call bell in reach.

## 2021-06-07 NOTE — ED Triage Notes (Signed)
Pt comes from [facility] for AMS. Lives by self. Pt extremely confused and unable to make coherent sentences in triage. Pt father contacted in triage pt normally a&o x 4 lives alone. Unknown last known well time.

## 2021-06-07 NOTE — Progress Notes (Signed)
*  PRELIMINARY RESULTS* Echocardiogram 2D Echocardiogram has been performed.  [name] 06/07/2021, 11:52 AM

## 2021-06-08 ENCOUNTER — Other Ambulatory Visit: Payer: Self-pay

## 2021-06-08 ENCOUNTER — Encounter: Payer: Self-pay | Admitting: Internal Medicine

## 2021-06-08 DIAGNOSIS — I6349 Cerebral infarction due to embolism of other cerebral artery: Secondary | ICD-10-CM

## 2021-06-08 LAB — AMMONIA: Ammonia: 21 umol/L (ref 9–35)

## 2021-06-08 LAB — LIPID PANEL
Cholesterol: 152 mg/dL (ref 0–200)
HDL: 35 mg/dL — ABNORMAL LOW (ref >40)
LDL Cholesterol: 106 mg/dL — ABNORMAL HIGH (ref 0–99)
Total CHOL/HDL Ratio: 4.3 RATIO
Triglycerides: 56 mg/dL (ref <150)
VLDL: 11 mg/dL (ref 0–40)

## 2021-06-08 LAB — URINE CULTURE
Culture: NO GROWTH
Special Requests: NORMAL

## 2021-06-08 LAB — HEMOGLOBIN A1C
Hgb A1c MFr Bld: 5.5 % (ref 4.8–5.6)
Mean Plasma Glucose: 111.15 mg/dL

## 2021-06-08 LAB — HIV ANTIBODY (ROUTINE TESTING W REFLEX): HIV Screen 4th Generation wRfx: NONREACTIVE

## 2021-06-08 MED ORDER — METOPROLOL TARTRATE 50 MG PO TABS
50.0000 mg | ORAL_TABLET | Freq: Two times a day (BID) | ORAL | Status: DC
  Administered 2021-06-08 – 2021-06-14 (×13): 50 mg via ORAL
  Filled 2021-06-08 (×13): qty 1

## 2021-06-08 MED ORDER — APIXABAN 5 MG PO TABS
5.0000 mg | ORAL_TABLET | Freq: Two times a day (BID) | ORAL | Status: DC
  Administered 2021-06-08: 5 mg via ORAL
  Filled 2021-06-08: qty 1

## 2021-06-08 NOTE — Progress Notes (Addendum)
At shift report patient's son was at bedside. Son expressed that he would like nurse to talk to the patient's sister-in-law, Zayvier Caravello, about care, information on stroke, and plan for after the hospital. Nurse asked patient if talking to Clydie Braun would be acceptable. Patient agreed.   Around 11:30 pm, Nurse received a call from Clydie Braun about what the plans for discharge were and the residuals of the stroke. Clydie Braun would like to speak with social work to help with discharge planning. She stated that they would like to keep him in his home if possible with daily check ins from family. Clydie Braun [facility] phone number is [phone].
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
## 2021-06-08 NOTE — Progress Notes (Signed)
Neurology Progress Note  Patient ID: Darrell Collier is a 70 y.o. male past medical history of dilated cardiomyopathy, hypertension, peripheral arterial disease, atrial fibrillation on anticoagulation unknown when his last dose of his Eliquis was, unable to reliably provide history for presentation of confusion and unable to make coherent sentences when evaluated at [facility] this morning.  I have not been able to reach any family member but I was told that his father contacted and said that he is usually coherent, alert and oriented x4 but was not making any sense.  He was last known well sometime Saturday, June 02, 2021.   Subjective: Patient is unable to follow commands or answer questions appropriately (answers words but they do not make sense in context). Does not appear to have any new deficits today; denies pain.  Interval data: MRI brain wo: acute infarct L posterior MCA w/o hemorrhage or mass effect (personal review) TTE showed no e/o intracardiac clot and no e/o PFO LDL 106 A1c 5.5  General awake alert appears frustrated with what ever is going on around him. HEENT: Normocephalic/atraumatic Lungs: Clear Cardiovascular: Irregular irregular and tachycardic Extremities warm well perfused Does not cooperate much with the exam Neurological exam Is awake alert Speech is fluent but his sentences make no sense in terms of the contex to the conversation. He is unable to name repeat or comprehend. Appears to have dense receptive aphasia. Not very cooperative with exam Does not appear to be in apparent distress Cranial nerve examination: Pupils appear round and reactive to light, he is able to track the examiner on both sides, does not appear to have a field cut based on blink to threat, face appears symmetric at rest but he is unable to cooperate with the exam to smile and let me examine him completely. Motor examination: No drift in any extremity although cannot cooperate with formal strength  testing 2/2 aphasia.  Difficult to gauge sensation or coordination at this time due to lack of his cooperation. NIH stroke scale 1a Level of Conscious.: 0 1b LOC Questions: 2 1c LOC Commands: 2 2 Best Gaze: 0 3 Visual: 0 4 Facial Palsy: 0 5a Motor Arm - left: 0 5b Motor Arm - Right: 0 6a Motor Leg - Left: 0 6b Motor Leg - Right: 0 7 Limb Ataxia: 0 8 Sensory: 0 9 Best Language: 2 10 Dysarthria: 0 11 Extinct. 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 fib and peripheral vascular disease as outpatient   Recommendations: On personal review of brain MRI the infarct is likely 2-61 days old (not 5) therefore would recommend waiting an additional 3 days to restart eliquis and obtaining noncontrast head CT prior to restarting to r/o interval hemorrhagic conversion Frequent neurochecks Telemetry Management of A. fib with RVR per primary team; avoid hypotension in setting of acute stroke MRI brain without contrast For now aspirin 81 should be fine-see eliquis recs below. From a neuro standpoint, aspirin can be d/c'd when eliquis restarted later this week, unless there is a cardiovascular indication for patient to be on aspirin in addition to therapeutic anticoagulation. High intensity statin 2D echo PT OT Speech therapy No need for permissive hypertension-can start normalizing blood pressure with goal blood pressure at discharge less than 140/90.  Will continue to follow.  [name], MD [facility] [phone]  If 7pm-  7am, please page neurology on call as listed in [place].

## 2021-06-08 NOTE — Progress Notes (Addendum)
[facility] PROGRESS NOTE    Darrell Collier  WJX:[id number] DOB: 1951-07-28 DOA: 06/07/2021 PCP: Danella Penton, MD      Brief Narrative:  Darrell Collier is a 70 y.o. M with HTN, A. Fib on Eliquis who presented with aphasia.  Patient lives alone, evidently was in her normal state of health until several days prior to admission when father had lunch with him.  Then on the day of admission the patient showed up to his PCPs office talking unintelligibly and was sent to the ER.  In the ER, he was in A. fib with rapid rate, started on diltiazem drip.  Blood pressure elevated.  CT of the head showed a subtle low-density in the posterior left temporal lobe.  Neurology and cardiology were consulted and the patient was admitted for stroke.        Assessment & Plan:  Acute left MCA stroke Presumably embolic.  Likely occurred some days ago.  MRI shows acute infarct in the posterior left MCA territory, without hemorrhagic conversion.  Noninvasive angiography shows severe bilateral PCA stenoses, no other large vessel occlusion, no MCA stenosis, no carotid disease.  Patient remains densely aphasic -Echocardiogram shows no cardiogenic source -Lipids ordered: Continue atorvastatin -Aspirin ordered at admission   -Atrial fibrillation: Previously known, restart Eliquis given MRI findings reassuring for no hemorrhage  ADDENDUM: New neurologist has over-read MRI brain and suggests stroke is only 30 days old, recommends holding Eliquis for 3 more days until 8/8 -Hold Eliquis  -tPA not given because last known normal unknown -Dysphagia screen ordered in ER -PT eval ordered -Smoking cessation: Not pertinent    Atrial Fibrillation with RVR Persistent atrial fibrillation Rates controlled -Hold anticoagulation - Continue home metoprolol, increase dose   Hypertension Pressure still elevated - Continue home metoprolol - Start amlodipine, goal BP <130  CKD IIIb Cr at baseline  1.5-2.0          Disposition: Status is: Inpatient  Remains inpatient appropriate because:Unsafe d/c plan  Dispo: The patient is from: Home              Anticipated d/c is to: SNF              Patient currently is not medically stable to d/c.   Difficult to place patient No       Level of care: Med-Surg       MDM: The below labs and imaging reports were reviewed and summarized above.  Medication management as above.    DVT prophylaxis:  apixaban (ELIQUIS) tablet 5 mg  Code Status: FULL Family Communication: called father by phone, no answer    Consultants:  Neurology Cardiology   Procedures:  8/4 echo -- normal EF, normal valves 8/4 CTA head and neck -- only PCA bilateral disase, no LVO 8/4 MRI brain -- left posterior MCA stroke         Subjective: Patient eating breakfast.  Speech is somewhat incomprehensible.  He clearly is able to articulate some answers to questions, but has had what appears to be receptive aphasia primarily.  No fever.  No apparent pain.  No concerns from nursing.      Objective: Vitals:   06/07/21 2140 06/07/21 2239 06/08/21 0301 06/08/21 0558  BP: (!) 151/84 (!) 163/90 (!) 159/92 (!) 162/118  Pulse: 73 70 72 100  Resp: (!) Temp: 98.4 F (36.9 C) 97.6 F (36.4 C) 98.9 F (37.2 C) 98 F (36.7 C)  TempSrc: Oral Oral Oral   SpO2: 98% 99% 99% 98%  Weight:  88.6 kg    Height:        Intake/Output Summary (Last 24 hours) at 06/08/2021 0902 Last data filed at 06/08/2021 0558 Gross per 24 hour  Intake 1451.25 ml  Output 600 ml  Net 851.25 ml   Filed Weights   06/07/21 0841 06/07/21 2239  Weight: 86.2 kg 88.6 kg    Examination: General appearance:  adult male, alert and in no acute distress.  Eating an omelette, watching television. HEENT: Anicteric, conjunctiva pink, lids and lashes normal. No nasal deformity, discharge, epistaxis.  Lips moist, dentition in good repair, oropharynx moist, no oral  lesions, hearing seems normal.   Skin: Warm and dry.  nojaundice.  No suspicious rashes or lesions. Cardiac: RRR, nl S1-S2, no murmurs appreciated.  No JVD, no LE edema.  Radial  pulses 2+ and symmetric. Respiratory: Normal respiratory rate and rhythm.  CTAB without rales or wheezes. Abdomen: Abdomen soft.  no TTP or guarding. No ascites, distension, hepatosplenomegaly.   MSK: No deformities or effusions.  Normal muscle bulk and tone, missing part of the left ring finger Neuro: Awake and alert.  EOMI, moves all extremities with normal strength and coordination. Speech fluent but at times inappropriate words, and unable to comprehend questions.    Psych: Sensorium intact and responding to questions, makes eye contact, attention normal. Affect irritable.  Judgment and insigh not possible to assess given dense receptive aphasia.    Data Reviewed: I have personally reviewed following labs and imaging studies:  CBC: Recent Labs  Lab 06/07/21 0845  WBC 8.5  NEUTROABS 5.5  HGB 14.4  HCT 42.4  MCV 90.4  PLT 178   Basic Metabolic Panel: Recent Labs  Lab 06/07/21 0845  NA 140  K 3.6  CL 104  CO2 23  GLUCOSE 83  BUN 26*  CREATININE 1.46*  CALCIUM 9.0  MG 1.7   GFR: Estimated Creatinine Clearance: 54.7 mL/min (A) (by C-G formula based on SCr of 1.46 mg/dL (H)). Liver Function Tests: Recent Labs  Lab 06/07/21 0845  AST 21  ALT 12  ALKPHOS 89  BILITOT 1.7*  PROT 7.7  ALBUMIN 3.8   No results for input(s): LIPASE, AMYLASE in the last 168 hours. Recent Labs  Lab 06/08/21 0415  AMMONIA 21   Coagulation Profile: Recent Labs  Lab 06/07/21 0845  INR 1.1   Cardiac Enzymes: No results for input(s): CKTOTAL, CKMB, CKMBINDEX, TROPONINI in the last 168 hours. BNP (last 3 results) No results for input(s): PROBNP in the last 8760 hours. HbA1C: No results for input(s): HGBA1C in the last 72 hours. CBG: Recent Labs  Lab 06/07/21 0834  GLUCAP 74   Lipid Profile: Recent  Labs    06/08/21 0415  CHOL 152  HDL 35*  LDLCALC 106*  TRIG 56  CHOLHDL 4.3   Thyroid Function Tests: No results for input(s): TSH, T4TOTAL, FREET4, T3FREE, THYROIDAB in the last 72 hours. Anemia Panel: No results for input(s): VITAMINB12, FOLATE, FERRITIN, TIBC, IRON, RETICCTPCT in the last 72 hours. Urine analysis:    Component Value Date/Time   COLORURINE YELLOW (A) 06/07/2021 1024   APPEARANCEUR CLEAR (A) 06/07/2021 1024   LABSPEC 1.044 (H) 06/07/2021 1024   PHURINE 5.0 06/07/2021 1024   GLUCOSEU NEGATIVE 06/07/2021 1024   HGBUR SMALL (A) 06/07/2021 1024   BILIRUBINUR NEGATIVE 06/07/2021 1024   KETONESUR 20 (A) 06/07/2021 1024   PROTEINUR NEGATIVE 06/07/2021 1024   NITRITE  NEGATIVE 06/07/2021 1024   LEUKOCYTESUR NEGATIVE 06/07/2021 1024   Sepsis Labs: (procalcitonin:4,lacticacidven:4)  ) Recent Results (from the past 240 hour(s))  Urine Culture     Status: None   Collection Time: 06/07/21 10:24 AM   Specimen: Urine, Catheterized  Result Value Ref Range Status   Specimen Description   Final    URINE, CATHETERIZED Performed at [facility], [address], [place], [place] [address]    Special Requests   Final    Normal Performed at [facility], [address], [place], [place] [address]    Culture   Final    NO GROWTH Performed at [facility], [address], [place], [place] [address]    Report Status 06/08/2021 FINAL  Final  Resp Panel by RT-PCR (Flu A&B, Covid) Nasopharyngeal Swab     Status: None   Collection Time: 06/07/21  1:01 PM   Specimen: Nasopharyngeal Swab; Nasopharyngeal(NP) swabs in vial transport medium  Result Value Ref Range Status   SARS Coronavirus 2 by RT PCR NEGATIVE NEGATIVE Final    Comment: (NOTE) SARS-CoV-2 target nucleic acids are NOT DETECTED.  The SARS-CoV-2 RNA is generally detectable in upper respiratory specimens during the acute phase of infection. The lowest concentration of  SARS-CoV-2 viral copies this assay can detect is 138 copies/mL. A negative result does not preclude SARS-Cov-2 infection and should not be used as the sole basis for treatment or other patient management decisions. A negative result may occur with  improper specimen collection/handling, submission of specimen other than nasopharyngeal swab, presence of viral mutation(s) within the areas targeted by this assay, and inadequate number of viral copies(<138 copies/mL). A negative result must be combined with clinical observations, patient history, and epidemiological information. The expected result is Negative.  Fact Sheet for Patients:  BloggerCourse.com  Fact Sheet for Healthcare Providers:  SeriousBroker.it  This test is no t yet approved or cleared by the [place] FDA and  has been authorized for detection and/or diagnosis of SARS-CoV-2 by FDA under an Emergency Use Authorization (EUA). This EUA will remain  in effect (meaning this test can be used) for the duration of the COVID-19 declaration under Section 564(b)(1) of the Act, 21 U.S.C.section 360bbb-3(b)(1), unless the authorization is terminated  or revoked sooner.       Influenza A by PCR NEGATIVE NEGATIVE Final   Influenza B by PCR NEGATIVE NEGATIVE Final    Comment: (NOTE) The Xpert Xpress SARS-CoV-2/FLU/RSV plus assay is intended as an aid in the diagnosis of influenza from Nasopharyngeal swab specimens and should not be used as a sole basis for treatment. Nasal washings and aspirates are unacceptable for Xpert Xpress SARS-CoV-2/FLU/RSV testing.  Fact Sheet for Patients: BloggerCourse.com  Fact Sheet for Healthcare Providers: SeriousBroker.it  This test is not yet approved or cleared by the [place] FDA and has been authorized for detection and/or diagnosis of SARS-CoV-2 by FDA under an Emergency Use  Authorization (EUA). This EUA will remain in effect (meaning this test can be used) for the duration of the COVID-19 declaration under Section 564(b)(1) of the Act, 21 U.S.C. section 360bbb-3(b)(1), unless the authorization is terminated or revoked.  Performed at [facility], [address] [place], [place] [address]          Radiology Studies: CT HEAD WO CONTRAST  Result Date: 06/07/2021 CLINICAL DATA:  Altered mental status EXAM: CT HEAD WITHOUT CONTRAST TECHNIQUE: Contiguous axial images were obtained from the base of the skull through  the vertex without intravenous contrast. COMPARISON:  None. FINDINGS: Brain: Subtle low-density noted in the posterior left temporal lobe concerning for early acute infarction. No hemorrhage. Chronic small vessel disease throughout the deep white matter. 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 Vascular: No hyperdense vessel or unexpected calcification. Skull: No acute calvarial abnormality. Sinuses/Orbits: Visualized paranasal sinuses and mastoids clear. Orbital soft tissues unremarkable. Other: None IMPRESSION: Subtle low-density in the posterior left temporal lobe concerning for early acute infarction. This could be further evaluated with MRI if felt clinically indicated. 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 Chronic small vessel disease. Electronically Signed   By: Charlett Nose M.D.   On: 06/07/2021 09:01   MR BRAIN WO CONTRAST  Result Date: 06/07/2021 CLINICAL DATA:  Acute neurologic deficit EXAM: MRI HEAD WITHOUT CONTRAST TECHNIQUE: Multiplanar, multiecho pulse sequences of the brain and surrounding structures were obtained without intravenous contrast. COMPARISON:  None. FINDINGS: Brain: Acute infarct within the posterior left MCA territory. Multiple falls a of chronic microhemorrhage in the left cerebellum. There is multifocal hyperintense T2-weighted signal within the white matter. Generalized volume loss without a clear lobar  predilection. The midline structures are normal. There is an old right cerebellar infarct. Vascular: Major flow voids are preserved. Skull and upper cervical spine: Normal calvarium and skull base. Visualized upper cervical spine and soft tissues are normal. Sinuses/Orbits:No paranasal sinus fluid levels or advanced mucosal thickening. No mastoid or middle ear effusion. Normal orbits. 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 Electronically Signed   By: Deatra Robinson M.D.   On: 06/07/2021 21:37   ECHOCARDIOGRAM COMPLETE BUBBLE STUDY  Result Date: 06/07/2021    ECHOCARDIOGRAM REPORT   Patient Name:   Darrell Collier Date of Exam: 06/07/2021 Medical Rec #:  [id number]       Height:       73.0 in Accession #:    [id number]      Weight:       190.0 lb Date of Birth:  1951-03-05       BSA:          2.105 m Patient Age:    70 years        BP:           166/120 mmHg Patient Gender: M               HR:           127 bpm. Exam Location:  [facility] Procedure: 2D Echo, Cardiac Doppler, Color Doppler and Saline Contrast Bubble            Study Indications:     Stroke 434.91 / I63.9  History:         Patient has no prior history of Echocardiogram examinations.                  Arrythmias:Atrial Fibrillation; Risk Factors:Hypertension.  Sonographer:     [name] RDCS (AE) Referring Phys:  [id number] [facility] Diagnosing Phys: Julien Nordmann MD  Sonographer Comments: Suboptimal apical window. Image acquisition challenging due to respiratory motion. IMPRESSIONS  1. Left ventricular ejection fraction, by estimation, is 60 to 65%. The left ventricle has normal function. The left ventricle has no regional wall motion abnormalities. Left ventricular diastolic parameters are indeterminate.  2. Right ventricular systolic function is normal. The right ventricular size is normal.  3. The mitral valve is normal in structure. Mild mitral valve regurgitation. No evidence of mitral  stenosis.  4. Agitated saline contrast bubble study was negative, with no evidence of any interatrial shunt. FINDINGS  Left Ventricle: Left ventricular ejection fraction, by estimation, is 60 to 65%. The left ventricle has normal function. The left ventricle has no regional wall motion abnormalities. The left ventricular internal cavity size was normal in size. There is  no left ventricular hypertrophy. Left ventricular diastolic parameters are indeterminate. Right Ventricle: The right ventricular size is normal. No increase in right ventricular wall thickness. Right ventricular systolic function is normal. Left Atrium: Left atrial size was normal in size. Right Atrium: Right atrial size was normal in size. Pericardium: There is no evidence of pericardial effusion. Mitral Valve: The mitral valve is normal in structure. Mild mitral valve regurgitation. No evidence of mitral valve stenosis. Tricuspid Valve: The tricuspid valve is normal in structure. Tricuspid valve regurgitation is mild . No evidence of tricuspid stenosis. Aortic Valve: The aortic valve is normal in structure. Aortic valve regurgitation is not visualized. No aortic stenosis is present. Aortic valve mean gradient measures 3.0 mmHg. Aortic valve peak gradient measures 4.5 mmHg. Aortic valve area, by VTI measures 2.48 cm. Pulmonic Valve: The pulmonic valve was normal in structure. Pulmonic valve regurgitation is not visualized. No evidence of pulmonic stenosis. Aorta: The aortic root is normal in size and structure. Venous: The inferior vena cava is normal in size with greater than 50% respiratory variability, suggesting right atrial pressure of 3 mmHg. IAS/Shunts: No atrial level shunt detected by color flow Doppler. Agitated saline contrast was given intravenously to evaluate for intracardiac shunting. Agitated saline contrast bubble study was negative, with no evidence of any interatrial shunt.  LEFT VENTRICLE PLAX 2D LVIDd:         3.91 cm LVIDs:          2.53 cm LV PW:         1.20 cm LV IVS:        0.82 cm LVOT diam:     2.00 cm LV SV:         37 LV SV Index:   17 LVOT Area:     3.14 cm  RIGHT VENTRICLE RV Basal diam:  4.12 cm RV S prime:     14.40 cm/s TAPSE (M-mode): 3.3 cm LEFT ATRIUM              Index       RIGHT ATRIUM           Index LA diam:        3.70 cm  1.76 cm/m  RA Area:     23.60 cm LA Vol (A2C):   45.6 ml  21.66 ml/m RA Volume:   88.70 ml  42.13 ml/m LA Vol (A4C):   103.0 ml 48.93 ml/m LA Biplane Vol: 75.4 ml  35.82 ml/m  AORTIC VALVE                   PULMONIC VALVE AV Area (Vmax):    1.95 cm    PV Vmax:        0.44 m/s AV Area (Vmean):   1.92 cm    PV Peak grad:   0.8 mmHg AV Area (VTI):     2.48 cm    RVOT Peak grad: 4 mmHg AV Vmax:           106.00 cm/s AV Vmean:          76.100 cm/s AV VTI:  0.148 m AV Peak Grad:      4.5 mmHg AV Mean Grad:      3.0 mmHg LVOT Vmax:         65.90 cm/s LVOT Vmean:        46.400 cm/s LVOT VTI:          0.117 m LVOT/AV VTI ratio: 0.79  AORTA Ao Root diam: 2.70 cm MITRAL VALVE               TRICUSPID VALVE MV Area (PHT): 5.09 cm    TR Peak grad:   18.3 mmHg MV Decel Time: 149 msec    TR Vmax:        214.00 cm/s MV E velocity: 87.40 cm/s                            SHUNTS                            Systemic VTI:  0.12 m                            Systemic Diam: 2.00 cm Julien Nordmann MD Electronically signed by Julien Nordmann MD Signature Date/Time: 06/07/2021/2:18:32 PM    Final    CT ANGIO HEAD NECK W WO CM (CODE STROKE)  Result Date: 06/07/2021 CLINICAL DATA:  Stroke.  Mental status change. EXAM: CT ANGIOGRAPHY HEAD AND NECK TECHNIQUE: Multidetector CT imaging of the head and neck was performed using the standard protocol during bolus administration of intravenous contrast. Multiplanar CT image reconstructions and MIPs were obtained to evaluate the vascular anatomy. Carotid stenosis measurements (when applicable) are obtained utilizing NASCET criteria, using the distal internal carotid  diameter as the denominator. CONTRAST:  69mL OMNIPAQUE IOHEXOL 350 MG/ML SOLN COMPARISON:  Carotid Doppler ultrasound 08/01/2020 FINDINGS: CTA NECK FINDINGS Aortic arch: Standard 3 vessel aortic arch with widely patent arch vessel origins. Right carotid system: Patent with a small amount of calcified and soft plaque at the carotid bifurcation. No evidence of a dissection or significant stenosis. Left carotid system: Patent with predominantly soft plaque in the carotid bulb which is mildly ulcerated. No evidence of a significant stenosis or dissection. Vertebral arteries: Patent without evidence of a dissection, stenosis, or significant atherosclerosis. Slightly dominant left vertebral artery. Skeleton: Moderate cervical disc and facet degeneration. Other neck: Bilateral thyroid nodules including an approximately 2.9 cm nodule on the left. Upper chest: Mild centrilobular emphysema. Review of the MIP images confirms the above findings CTA HEAD FINDINGS Anterior circulation: The internal carotid arteries are patent from skull base to carotid termini with mild atherosclerotic plaque bilaterally and a mild stenosis of the proximal left supraclinoid ICA. ACAs and MCAs are patent without evidence of a proximal branch occlusion or significant proximal stenosis. No aneurysm is identified. Posterior circulation: The intracranial vertebral arteries are widely patent to the basilar. Patent PICA, AICA, and SCA origins are seen bilaterally. The basilar artery is widely patent. There is a diminutive left posterior communicating artery. Both PCAs are patent. Stenoses near the P1-P2 junctions are severe on the right and mild on the left. No aneurysm is identified. Venous sinuses: Patent. Anatomic variants: None. Review of the MIP images confirms the above findings IMPRESSION: 1. No large vessel occlusion. 2. Cervical carotid atherosclerosis including ulcerated plaque in the left carotid bulb. No significant stenosis or dissection.  3.  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 4.  Emphysema (ICD10-J43.9). These results were called by telephone at the time of interpretation on 06/07/2021 at 10:09 am to Dr. Antoine PrimasZachary Smith, who verbally acknowledged these results. Electronically Signed   By: Sebastian AcheAllen  Grady M.D.   On: 06/07/2021 10:18        Scheduled Meds:  apixaban  5 mg Oral BID   aspirin  324 mg Oral Once   aspirin EC  81 mg Oral Daily   atorvastatin  80 mg Oral Daily   metoprolol succinate  25 mg Oral BID   Continuous Infusions:   LOS: 1 day    Time spent: 25 minutes    Alberteen Samhristopher P Trevor Duty, MD [facility] 06/08/2021, 9:02 AM     Please page though AMION or Epic secure chat:  For [employer] password, Higher education careers advisercontact charge nurse

## 2021-06-08 NOTE — Progress Notes (Signed)
[facility] Cardiology [facility] Encounter Note  Patient: Darrell Collier / Admit Date: 06/07/2021 / Date of Encounter: 06/08/2021, 9:40 AM   Subjective: 70 year old male with known dilated cardiomyopathy, hypertension, hyperlipidemia, paroxysmal nonvalvular atrial fibrillation.  Patient presented to the ED for symptoms concerning for stroke.  Patient noted to be in atrial fibrillation with RVR on presentation and is rate controlled with metoprolol.  Patient has been on appropriate medication management as an outpatient which included metoprolol, atorvastatin, Eliquis with an unknown last dosage of his Eliquis.  Patient is noted to be confused on presentation today.  There is no current evidence of any chest pain, other cardiovascular symptoms or heart failure related symptoms.  Echocardiogram with bubble study showed normal left ventricular systolic function with an estimated ejection fraction of 60-65%.  Negative bubble study with no evidence of interatrial shunt.  Brain MRI showed acute infarct within the posterior left MCA territory without hemorrhagic effect.  Review of Systems: Unable to assess due to patient's confusion  Objective: Telemetry: Atrial fibrillation at a ventricular rate of approximately 100 bpm Physical Exam: Blood pressure (!) 162/118, pulse 100, temperature 98 F (36.7 C), resp. rate 18, height 6\' 1"  (1.854 m), weight 88.6 kg, SpO2 98 %. Body mass index is 25.77 kg/m. General: Well developed, well nourished, in no acute distress. Head: Normocephalic, atraumatic, sclera non-icteric, no xanthomas, nares are without discharge. Neck: No apparent masses Lungs: Normal respirations with no wheezes, no rhonchi, no rales , no crackles   Heart: Regular rate and rhythm, normal S1 S2, no murmur, no rub, no gallop, PMI is normal size and placement, carotid upstroke normal without bruit, jugular venous pressure normal Abdomen: Soft, non-tender, non-distended with normoactive bowel  sounds. No hepatosplenomegaly. Abdominal aorta is normal size without bruit Extremities: No edema, no clubbing, no cyanosis, no ulcers,  Peripheral: 2+ radial, 2+ femoral, 2+ dorsal pedal pulses Neuro: Alert and oriented. Moves all extremities spontaneously. Psych: Does not respond to questions appropriately   Intake/Output Summary (Last 24 hours) at 06/08/2021 0940 Last data filed at 06/08/2021 0558 Gross per 24 hour  Intake 1451.25 ml  Output 600 ml  Net 851.25 ml    Inpatient Medications:   apixaban  5 mg Oral BID   aspirin  324 mg Oral Once   aspirin EC  81 mg Oral Daily   atorvastatin  80 mg Oral Daily   metoprolol tartrate  50 mg Oral BID   Infusions:   Labs: Recent Labs    06/07/21 0845  NA 140  K 3.6  CL 104  CO2 23  GLUCOSE 83  BUN 26*  CREATININE 1.46*  CALCIUM 9.0  MG 1.7   Recent Labs    06/07/21 0845  AST 21  ALT 12  ALKPHOS 89  BILITOT 1.7*  PROT 7.7  ALBUMIN 3.8   Recent Labs    06/07/21 0845  WBC 8.5  NEUTROABS 5.5  HGB 14.4  HCT 42.4  MCV 90.4  PLT 178   No results for input(s): CKTOTAL, CKMB, TROPONINI in the last 72 hours. Invalid input(s): POCBNP No results for input(s): HGBA1C in the last 72 hours.   Weights: Filed Weights   06/07/21 0841 06/07/21 2239  Weight: 86.2 kg 88.6 kg     Radiology/Studies:  CT HEAD WO CONTRAST  Result Date: 06/07/2021 CLINICAL DATA:  Altered mental status EXAM: CT HEAD WITHOUT CONTRAST TECHNIQUE: Contiguous axial images were obtained from the base of the skull through the vertex without intravenous contrast. COMPARISON:  None. FINDINGS: Brain: Subtle low-density noted in the posterior left temporal lobe concerning for early acute infarction. No hemorrhage. Chronic small vessel disease throughout the deep white matter. 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 Vascular: No hyperdense vessel or unexpected calcification. Skull: No acute calvarial abnormality. Sinuses/Orbits: Visualized paranasal sinuses  and mastoids clear. Orbital soft tissues unremarkable. Other: None IMPRESSION: Subtle low-density in the posterior left temporal lobe concerning for early acute infarction. This could be further evaluated with MRI if felt clinically indicated. 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 Chronic small vessel disease. Electronically Signed   By: Charlett Nose M.D.   On: 06/07/2021 09:01   MR BRAIN WO CONTRAST  Result Date: 06/07/2021 CLINICAL DATA:  Acute neurologic deficit EXAM: MRI HEAD WITHOUT CONTRAST TECHNIQUE: Multiplanar, multiecho pulse sequences of the brain and surrounding structures were obtained without intravenous contrast. COMPARISON:  None. FINDINGS: Brain: Acute infarct within the posterior left MCA territory. Multiple falls a of chronic microhemorrhage in the left cerebellum. There is multifocal hyperintense T2-weighted signal within the white matter. Generalized volume loss without a clear lobar predilection. The midline structures are normal. There is an old right cerebellar infarct. Vascular: Major flow voids are preserved. Skull and upper cervical spine: Normal calvarium and skull base. Visualized upper cervical spine and soft tissues are normal. Sinuses/Orbits:No paranasal sinus fluid levels or advanced mucosal thickening. No mastoid or middle ear effusion. Normal orbits. 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 Electronically Signed   By: Deatra Robinson M.D.   On: 06/07/2021 21:37   ECHOCARDIOGRAM COMPLETE BUBBLE STUDY  Result Date: 06/07/2021    ECHOCARDIOGRAM REPORT   Patient Name:   Darrell Collier Date of Exam: 06/07/2021 Medical Rec #:  [id number]       Height:       73.0 in Accession #:    [id number]      Weight:       190.0 lb Date of Birth:  September 09, 1951       BSA:          2.105 m Patient Age:    70 years        BP:           166/120 mmHg Patient Gender: M               HR:           127 bpm. Exam Location:  [facility] Procedure: 2D  Echo, Cardiac Doppler, Color Doppler and Saline Contrast Bubble            Study Indications:     Stroke 434.91 / I63.9  History:         Patient has no prior history of Echocardiogram examinations.                  Arrythmias:Atrial Fibrillation; Risk Factors:Hypertension.  Sonographer:     [name] RDCS (AE) Referring Phys:  [id number] [facility] Diagnosing Phys: Julien Nordmann MD  Sonographer Comments: Suboptimal apical window. Image acquisition challenging due to respiratory motion. IMPRESSIONS  1. Left ventricular ejection fraction, by estimation, is 60 to 65%. The left ventricle has normal function. The left ventricle has no regional wall motion abnormalities. Left ventricular diastolic parameters are indeterminate.  2. Right ventricular systolic function is normal. The right ventricular size is normal.  3. The mitral valve is normal in structure. Mild mitral valve regurgitation. No evidence of mitral stenosis.  4. Agitated saline contrast bubble study  was negative, with no evidence of any interatrial shunt. FINDINGS  Left Ventricle: Left ventricular ejection fraction, by estimation, is 60 to 65%. The left ventricle has normal function. The left ventricle has no regional wall motion abnormalities. The left ventricular internal cavity size was normal in size. There is  no left ventricular hypertrophy. Left ventricular diastolic parameters are indeterminate. Right Ventricle: The right ventricular size is normal. No increase in right ventricular wall thickness. Right ventricular systolic function is normal. Left Atrium: Left atrial size was normal in size. Right Atrium: Right atrial size was normal in size. Pericardium: There is no evidence of pericardial effusion. Mitral Valve: The mitral valve is normal in structure. Mild mitral valve regurgitation. No evidence of mitral valve stenosis. Tricuspid Valve: The tricuspid valve is normal in structure. Tricuspid valve regurgitation is mild . No evidence of tricuspid  stenosis. Aortic Valve: The aortic valve is normal in structure. Aortic valve regurgitation is not visualized. No aortic stenosis is present. Aortic valve mean gradient measures 3.0 mmHg. Aortic valve peak gradient measures 4.5 mmHg. Aortic valve area, by VTI measures 2.48 cm. Pulmonic Valve: The pulmonic valve was normal in structure. Pulmonic valve regurgitation is not visualized. No evidence of pulmonic stenosis. Aorta: The aortic root is normal in size and structure. Venous: The inferior vena cava is normal in size with greater than 50% respiratory variability, suggesting right atrial pressure of 3 mmHg. IAS/Shunts: No atrial level shunt detected by color flow Doppler. Agitated saline contrast was given intravenously to evaluate for intracardiac shunting. Agitated saline contrast bubble study was negative, with no evidence of any interatrial shunt.  LEFT VENTRICLE PLAX 2D LVIDd:         3.91 cm LVIDs:         2.53 cm LV PW:         1.20 cm LV IVS:        0.82 cm LVOT diam:     2.00 cm LV SV:         37 LV SV Index:   17 LVOT Area:     3.14 cm  RIGHT VENTRICLE RV Basal diam:  4.12 cm RV S prime:     14.40 cm/s TAPSE (M-mode): 3.3 cm LEFT ATRIUM              Index       RIGHT ATRIUM           Index LA diam:        3.70 cm  1.76 cm/m  RA Area:     23.60 cm LA Vol (A2C):   45.6 ml  21.66 ml/m RA Volume:   88.70 ml  42.13 ml/m LA Vol (A4C):   103.0 ml 48.93 ml/m LA Biplane Vol: 75.4 ml  35.82 ml/m  AORTIC VALVE                   PULMONIC VALVE AV Area (Vmax):    1.95 cm    PV Vmax:        0.44 m/s AV Area (Vmean):   1.92 cm    PV Peak grad:   0.8 mmHg AV Area (VTI):     2.48 cm    RVOT Peak grad: 4 mmHg AV Vmax:           106.00 cm/s AV Vmean:          76.100 cm/s AV VTI:            0.148 m AV Peak Grad:  4.5 mmHg AV Mean Grad:      3.0 mmHg LVOT Vmax:         65.90 cm/s LVOT Vmean:        46.400 cm/s LVOT VTI:          0.117 m LVOT/AV VTI ratio: 0.79  AORTA Ao Root diam: 2.70 cm MITRAL VALVE                TRICUSPID VALVE MV Area (PHT): 5.09 cm    TR Peak grad:   18.3 mmHg MV Decel Time: 149 msec    TR Vmax:        214.00 cm/s MV E velocity: 87.40 cm/s                            SHUNTS                            Systemic VTI:  0.12 m                            Systemic Diam: 2.00 cm Julien Nordmann MD Electronically signed by Julien Nordmann MD Signature Date/Time: 06/07/2021/2:18:32 PM    Final    CT ANGIO HEAD NECK W WO CM (CODE STROKE)  Result Date: 06/07/2021 CLINICAL DATA:  Stroke.  Mental status change. EXAM: CT ANGIOGRAPHY HEAD AND NECK TECHNIQUE: Multidetector CT imaging of the head and neck was performed using the standard protocol during bolus administration of intravenous contrast. Multiplanar CT image reconstructions and MIPs were obtained to evaluate the vascular anatomy. Carotid stenosis measurements (when applicable) are obtained utilizing NASCET criteria, using the distal internal carotid diameter as the denominator. CONTRAST:  56mL OMNIPAQUE IOHEXOL 350 MG/ML SOLN COMPARISON:  Carotid Doppler ultrasound 08/01/2020 FINDINGS: CTA NECK FINDINGS Aortic arch: Standard 3 vessel aortic arch with widely patent arch vessel origins. Right carotid system: Patent with a small amount of calcified and soft plaque at the carotid bifurcation. No evidence of a dissection or significant stenosis. Left carotid system: Patent with predominantly soft plaque in the carotid bulb which is mildly ulcerated. No evidence of a significant stenosis or dissection. Vertebral arteries: Patent without evidence of a dissection, stenosis, or significant atherosclerosis. Slightly dominant left vertebral artery. Skeleton: Moderate cervical disc and facet degeneration. Other neck: Bilateral thyroid nodules including an approximately 2.9 cm nodule on the left. Upper chest: Mild centrilobular emphysema. Review of the MIP images confirms the above findings CTA HEAD FINDINGS Anterior circulation: The internal carotid arteries are patent  from skull base to carotid termini with mild atherosclerotic plaque bilaterally and a mild stenosis of the proximal left supraclinoid ICA. ACAs and MCAs are patent without evidence of a proximal branch occlusion or significant proximal stenosis. No aneurysm is identified. Posterior circulation: The intracranial vertebral arteries are widely patent to the basilar. Patent PICA, AICA, and SCA origins are seen bilaterally. The basilar artery is widely patent. There is a diminutive left posterior communicating artery. Both PCAs are patent. Stenoses near the P1-P2 junctions are severe on the right and mild on the left. No aneurysm is identified. Venous sinuses: Patent. Anatomic variants: None. Review of the MIP images confirms the above findings IMPRESSION: 1. No large vessel occlusion. 2. Cervical carotid atherosclerosis including ulcerated plaque in the left carotid bulb. No significant stenosis or dissection. 3. 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 4.  Emphysema (ICD10-J43.9). These results were called by telephone at the time of interpretation on 06/07/2021 at 10:09 am to Dr. Antoine Primas, who verbally acknowledged these results. Electronically Signed   By: Sebastian Ache M.D.   On: 06/07/2021 10:18     Assessment and Recommendation  70 y.o. male with a past medical history of hypertension, hyperlipidemia, paroxysmal nonvalvular atrial fibrillation who presented with rapid ventricular response with significant stroke symptoms.  MRI showed no evidence of hemorrhagic effect and echocardiogram showed no evidence of left ventricular systolic dysfunction or valvular heart disease and no evidence of interatrial shunt  1.  Restart patient's dose of Eliquis at 5 mg twice daily 2.  Per neurology, aspirin daily for treatment of possible ischemia 3.  Increased dose of metoprolol to 50 mg twice daily for better heart rate control 4.  Start physical rehabilitation  Signed, Maralyn Sago PA-C

## 2021-06-08 NOTE — Progress Notes (Signed)
Rehab Admissions Coordinator Note:  Patient was screened by Clois Dupes for appropriateness for an Inpatient Acute Rehab Admit at [facility] in [place] per therapy rec. At this time I will place rehab consult order an Admissions Coordinator will follow up for full assessment of candidacy.  Clois Dupes RN MSN 06/08/2021, 4:07 PM  I can be reached at [phone].

## 2021-06-08 NOTE — TOC Initial Note (Signed)
Transition of Care [facility]) - Initial/Assessment Note    Patient Details  Name: Darrell Collier MRN: [id number] Date of Birth: 11/17/1950  [facility] CM/SW Contact:    Allayne Butcher, RN Phone Number: 06/08/2021, 4:52 PM  Clinical Narrative:                 Patient admitted to the hospital with Stroke, presenting confused and with expressive aphagia.  Patient does not have capacity to make decisions at this time so that responsibility will fall to his children, Darrell Collier and Darrell Collier.  Mark lives in [place] and is on his way here now.  Darrell Collier lives in [place] and is taking care of the patient's dogs while he is here in the hospital.  Patient also has several snakes as pets.    Patient's father Darrell Collier sees patient twice a month when he takes him and his other son out for lunch.  Darrell Collier reports that the patient is reclusive and his house is disorderly.   [name] says that the patient was normal when they were together this past Saturday.  Darrell Collier, the son is the primary contact.  RNCM discussed  CIR recommendation with Darrell Collier and he seems very receptive.   TOC will cont to follow.    Expected Discharge Plan: IP Rehab Facility Barriers to Discharge: Continued Medical Work up   Patient Goals and CMS Choice Patient states their goals for this hospitalization and ongoing recovery are:: patient unable to state goals CMS Medicare.gov Compare Post Acute Care list provided to:: Patient Represenative (must comment) Choice offered to / list presented to : Adult Children  Expected Discharge Plan and Services Expected Discharge Plan: IP Rehab Facility   Discharge Planning Services: CM Consult Post Acute Care Choice: IP Rehab Living arrangements for the past 2 months: Single Family Home                 DME Arranged: N/A DME Agency: NA                  Prior Living Arrangements/Services Living arrangements for the past 2 months: Single Family Home Lives with:: Self, Pets Patient  language and need for interpreter reviewed:: Yes Do you feel safe going back to the place where you live?: No   Darrell Collier unable to make decisions at this time  Need for Family Participation in Patient Care: Yes (Comment) (stroke) Care giver support system in place?: Yes (comment) (father, son and daughter)   Criminal Activity/Legal Involvement Pertinent to Current Situation/Hospitalization: No - Comment as needed  Activities of Daily Living   ADL Screening (condition at time of admission) Patient's cognitive ability adequate to safely complete daily activities?: No Is the patient deaf or have difficulty hearing?: No Does the patient have difficulty seeing, even when wearing glasses/contacts?: No Does the patient have difficulty concentrating, remembering, or making decisions?: No Patient able to express need for assistance with ADLs?: No Does the patient have difficulty dressing or bathing?: No Does the patient have difficulty walking or climbing stairs?: No  Permission Sought/Granted Permission sought to share information with : Case Manager, Family Supports, Oceanographer granted to share information with : Yes, Verbal Permission Granted  Share Information with NAME: Darrell Collier  Permission granted to share info w AGENCY: CIR  Permission granted to share info w Relationship: son and father     Emotional Assessment Appearance:: Appears stated age Attitude/Demeanor/Rapport: Angry, Irrational, Reactive, Suspicious, Inconsistent Affect (typically observed): Agitated Orientation: : Oriented  to Self Alcohol / Substance Use: Not Applicable Psych Involvement: No (comment)  Admission diagnosis:  Stroke [facility]) [I63.9] 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.9] Atrial fibrillation with RVR (HCC) [I48.91] Cerebral infarction, unspecified mechanism [facility]) [I63.9] Patient Active Problem List   Diagnosis Date Noted   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 [place] [address] Phone: [phone] Fax: [phone]     Social Determinants of Health (SDOH) Interventions    Readmission Risk Interventions No flowsheet data found.
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
## 2021-06-08 NOTE — Progress Notes (Addendum)
Physical Therapy Evaluation Patient Details Name: Darrell Collier MRN: [id number] DOB: 05-31-1951 Today's Date: 06/08/2021   History of Present Illness  Pt is a 70 y.o. M presenting to ED during episode of a-fib RVR w/ MRI showing Acute infarct within the posterior left MCA territory.   Clinical Impression  Pt alert, in bed with sitter preset. Pt is unable to answer questions regarding PLOF or home-setup due to acute aphasia. AOxO, but does follow 1-step commands using tactile, visual, and intermittent verbal cues.   Pt is able to transfer w/ mod-I for increased time. Pt ambulated 200 ft without AD and min-guard due to slight instability when ambulating with mild challenges. Pt is challenged with dual tasking particularly with dual-motor tasks such as turning the head while walking and navigating environment during dynamic tasks. Pt is also unable to hold tandem or single leg stance balance without support, reliant on at least UE support for these higher level balance assessments. Skilled PT intervention is indicated to address deficits in function, mobility, and to return to PLOF as able.  Discharge recommendations are CIR due to patient's potential to maximize function, safety, and independence.      Follow Up Recommendations CIR    Equipment Recommendations  None recommended by PT    Recommendations for Other Services       Precautions / Restrictions Precautions Precautions: Fall Restrictions Weight Bearing Restrictions: No      Mobility  Bed Mobility Overal bed mobility: Modified Independent             General bed mobility comments: Utilizes bed rails    Transfers Overall transfer level: Needs assistance Equipment used: None Transfers: Sit to/from Stand Sit to Stand: Modified independent (Device/Increase time)         General transfer comment: cues for safety and sequence. Pt demos good control  Ambulation/Gait Ambulation/Gait assistance: Min guard Gait  Distance (Feet): 200 Feet Assistive device: None       General Gait Details: Pt is challenged with dual tasking particuarly with dual motor-tasks such as turning the head while walking leading to cross over gait pattern.  Stairs            Wheelchair Mobility    Modified Rankin (Stroke Patients Only)       Balance Overall balance assessment: Needs assistance Sitting-balance support: Feet supported;No upper extremity supported Sitting balance-Leahy Scale: Good Sitting balance - Comments: Able to correct posterior lean and lift BLE from ground without LOB   Standing balance support: During functional activity Standing balance-Leahy Scale: Good Standing balance comment: Able to stand without extremity support               High Level Balance Comments: Pt is unable to maintain tandem stance or single leg stance without support Standardized Balance Assessment Standardized Balance Assessment : Dynamic Gait Index   Dynamic Gait Index Level Surface: Normal Change in Gait Speed: Mild Impairment Gait with Horizontal Head Turns: Moderate Impairment Gait with Vertical Head Turns: Moderate Impairment Gait and Pivot Turn: Mild Impairment Step Over Obstacle: Mild Impairment Step Around Obstacles: Normal Steps: Mild Impairment Total Score: 16       Pertinent Vitals/Pain Pain Assessment: Faces Faces Pain Scale: No hurt    Home Living Family/patient expects to be discharged to:: Private residence Living Arrangements: Alone Available Help at Discharge:  (unknown)                  Prior Function  Comments: Pt unable to provide information on PLOF and home environment due to acute aphasic state. Per chart review, pt lives alone.     Hand Dominance   Dominant Hand: Right    Extremity/Trunk Assessment   Upper Extremity Assessment Upper Extremity Assessment: Overall WFL for tasks assessed RUE Deficits / Details: CN 3, 4, 5,6, 7,12 intact with  midline symmetry of tongue. Able to perform finger opposition with reduced speed LUE Deficits / Details: Able to perform finger opposition with reduced speed    Lower Extremity Assessment Lower Extremity Assessment: RLE deficits/detail;LLE deficits/detail RLE Deficits / Details: Able to move RLE against gravity and during WB, but unable officially MMT due to receptive difficulties. Clonus, and hamstring spasticity absent. LLE Deficits / Details: Able to move RLE against gravity and during WB, but unable officially MMT due to receptive difficulties. Clonus, and hamstring spasticity absent.       Communication   Communication: Receptive difficulties  Cognition Arousal/Alertness: Awake/alert Behavior During Therapy: Agitated Overall Cognitive Status: Difficult to assess                                 General Comments: Pt is able to follow one-step commands with both verbal, tactile, and visual demonstrations ~ 75% of the time. Pt is able to responsd appropriately with verbal questioning and able to provide appropriate verbal responses intermittently ~ 30% of the time demonstrating some comprehension.      General Comments General comments (skin integrity, edema, etc.): Stairs were not formally tested with DGI    Exercises     Assessment/Plan    PT Assessment Patient needs continued PT services  PT Problem List Decreased strength;Decreased range of motion;Decreased activity tolerance;Decreased balance;Decreased mobility;Decreased coordination;Decreased safety awareness       PT Treatment Interventions Gait training;Stair training;Functional mobility training;Therapeutic activities;Therapeutic exercise;Neuromuscular re-education;Balance training    PT Goals (Current goals can be found in the Care Plan section)  Acute Rehab PT Goals Patient Stated Goal: Pt unable to state goal Time For Goal Achievement: 06/22/21 Potential to Achieve Goals: Fair    Frequency Min  2X/week   Barriers to discharge        Co-evaluation               AM-PAC PT "6 Clicks" Mobility  Outcome Measure Help needed turning from your back to your side while in a flat bed without using bedrails?: None Help needed moving from lying on your back to sitting on the side of a flat bed without using bedrails?: None Help needed moving to and from a bed to a chair (including a wheelchair)?: None Help needed standing up from a chair using your arms (e.g., wheelchair or bedside chair)?: None Help needed to walk in hospital room?: A Little Help needed climbing 3-5 steps with a railing? : A Little 6 Click Score: 22    End of Session Equipment Utilized During Treatment: Gait belt Activity Tolerance: Patient tolerated treatment well Patient left: in bed;with nursing/sitter in room;with bed alarm set   PT Visit Diagnosis: Unsteadiness on feet (R26.81);Other abnormalities of gait and mobility (R26.89);Other symptoms and signs involving the nervous system (R29.898)    Time: 9449-6759 PT Time Calculation (min) (ACUTE ONLY): 23 min   Charges:   PT Evaluation $PT Eval Low Complexity: 1 Low PT Treatments $Neuromuscular Re-education: 8-22 mins       [employer], SPT

## 2021-06-09 MED ORDER — AMLODIPINE BESYLATE 5 MG PO TABS
5.0000 mg | ORAL_TABLET | Freq: Every day | ORAL | Status: DC
  Administered 2021-06-09 – 2021-06-14 (×6): 5 mg via ORAL
  Filled 2021-06-09 (×6): qty 1

## 2021-06-09 MED ORDER — ADULT MULTIVITAMIN W/MINERALS CH
1.0000 | ORAL_TABLET | Freq: Every day | ORAL | Status: DC
  Administered 2021-06-09 – 2021-06-14 (×6): 1 via ORAL
  Filled 2021-06-09 (×6): qty 1

## 2021-06-09 MED ORDER — PROSOURCE PLUS PO LIQD
30.0000 mL | Freq: Two times a day (BID) | ORAL | Status: DC
  Filled 2021-06-09 (×11): qty 30

## 2021-06-09 NOTE — Progress Notes (Signed)
[facility] PROGRESS NOTE    DEANGELO BERNS  EYC:[id number] DOB: 1951-07-26 DOA: 06/07/2021 PCP: Danella Penton, MD      Brief Narrative:  Mr. Davies is a 70 y.o. M with HTN, A. Fib on Eliquis who presented with aphasia.  Patient lives alone, evidently was in her normal state of health until several days prior to admission when father had lunch with him.  Then on the day of admission the patient showed up to his PCPs office talking unintelligibly and was sent to the ER.  In the ER, he was in A. fib with rapid rate, started on diltiazem drip.  Blood pressure elevated.  CT of the head showed a subtle low-density in the posterior left temporal lobe.  Neurology and cardiology were consulted and the patient was admitted for stroke.        Assessment & Plan:  Acute left MCA stroke Presumably embolic.  Patient now able to articulate that this happened 2 to 3 days prior to admission, so likely 8/2.  He also explains that 6 months ago he had gross hematuria and stopped his Eliquis, has not restarted.  Here, MRI showed acute infarct in the posterior left MCA territory, without hemorrhagic conversion.  Noninvasive angiography showed severe bilateral PCA stenoses, no other large vessel occlusion, no MCA stenosis, no carotid disease.  Aphasia is considerably better today, although he still appears not to understand about a third or half of what despite doing, and his expressions are frequently nonsensical -Echocardiogram shows no cardiogenic source -Lipids ordered: Continue atorvastatin -Aspirin ordered at admission   -Atrial fibrillation: Previously known, had been on Eliquis for 6 months  Stroke on 8/2.  Neurology recommend CT head repeated 5 days (tomorrow), followed by resuming Eliquis if no hemorrhage.  - CT head tomorrow - Hold Eliquis - Continue aspirin until Eliquis restarted     Atrial fibrillation with RVR Persistent atrial fibrillation Metoprolol  increased by cardiology, rates controlled - Hold Eliquis - Continue metoprolol   Hypertension Blood pressure elevated - Continue metoprolol - Continue amlodipine  CKD IIIb Cr at baseline 1.5-2.0          Disposition: Status is: Inpatient  Remains inpatient appropriate because:Unsafe d/c plan  Dispo: The patient is from: Home              Anticipated d/c is to: Home              Patient currently is not medically stable to d/c.   Difficult to place patient No   Patient was admitted with embolic stroke.  Neurology recommended repeat CT head prior to initiating Eliquis.  We will obtain this tomorrow at 5 days, then restart Eliquis if no hemorrhage and discharge tomorrow.  Family confident they can provide care for the patient at home, declined SNF     Level of care: Med-Surg       MDM: The below labs and imaging reports were reviewed and summarized above.  Medication management as above.    DVT prophylaxis:   Code Status: FULL Family Communication: Sister at the bedside    Consultants:  Neurology Cardiology   Procedures:  8/4 echo -- normal EF, normal valves 8/4 CTA head and neck -- only PCA bilateral disase, no LVO 8/4 MRI brain -- left posterior MCA stroke         Subjective: Speech more fluent today and articulate, only a few Maloprim for symptoms.  Perceptive aphasia considerably better, understands most of  what is said to him.  No pain, no new weakness, change in consciousness, focal deficits.         Objective: Vitals:   06/08/21 2046 06/08/21 2339 06/09/21 0541 06/09/21 0742  BP: (!) 138/91 (!) 143/84 (!) 152/107 (!) 162/106  Pulse: (!) 109 73 71 66  Resp: Temp: 98.7 F (37.1 C) 98.7 F (37.1 C) 98.4 F (36.9 C) 98.1 F (36.7 C)  TempSrc: Oral Oral Oral Oral  SpO2: 98% 99% 96% 98%  Weight:      Height:       No intake or output data in the 24 hours ending 06/09/21 0850  Filed Weights   06/07/21 0841  06/07/21 2239  Weight: 86.2 kg 88.6 kg    Examination: General appearance: Well, sitting up in bed, interactive HEENT:     Skin:   Cardiac: Irregularly irregular, no murmurs, no JVD, no lower extremity edema  Respiratory: Normal respiratory rate and rhythm, lungs clear without rales or wheezes Abdomen: Abdomen soft no tenderness palpation or guarding, no ascites or distention MSK: No deformities or effusions.  Normal muscle bulk and tone, missing part of the left ring finger Neuro: Awake and alert, extraocular movements intact, moves upper extremities with normal strength and coordination, left leg 4//5 strength relative to the right, speech fluent but frequent mild trophism's, aphasia, some receptive aphasia noted.    Psych: Attention normal, affect normal, judgment insight appear normal   Data Reviewed: I have personally reviewed following labs and imaging studies:  CBC: Recent Labs  Lab 06/07/21 0845  WBC 8.5  NEUTROABS 5.5  HGB 14.4  HCT 42.4  MCV 90.4  PLT 178   Basic Metabolic Panel: Recent Labs  Lab 06/07/21 0845  NA 140  K 3.6  CL 104  CO2 23  GLUCOSE 83  BUN 26*  CREATININE 1.46*  CALCIUM 9.0  MG 1.7   GFR: Estimated Creatinine Clearance: 54.7 mL/min (A) (by C-G formula based on SCr of 1.46 mg/dL (H)). Liver Function Tests: Recent Labs  Lab 06/07/21 0845  AST 21  ALT 12  ALKPHOS 89  BILITOT 1.7*  PROT 7.7  ALBUMIN 3.8   No results for input(s): LIPASE, AMYLASE in the last 168 hours. Recent Labs  Lab 06/08/21 0415  AMMONIA 21   Coagulation Profile: Recent Labs  Lab 06/07/21 0845  INR 1.1   Cardiac Enzymes: No results for input(s): CKTOTAL, CKMB, CKMBINDEX, TROPONINI in the last 168 hours. BNP (last 3 results) No results for input(s): PROBNP in the last 8760 hours. HbA1C: Recent Labs    06/08/21 0415  HGBA1C 5.5   CBG: Recent Labs  Lab 06/07/21 0834  GLUCAP 74   Lipid Profile: Recent Labs    06/08/21 0415  CHOL 152  HDL  35*  LDLCALC 106*  TRIG 56  CHOLHDL 4.3   Thyroid Function Tests: No results for input(s): TSH, T4TOTAL, FREET4, T3FREE, THYROIDAB in the last 72 hours. Anemia Panel: No results for input(s): VITAMINB12, FOLATE, FERRITIN, TIBC, IRON, RETICCTPCT in the last 72 hours. Urine analysis:    Component Value Date/Time   COLORURINE YELLOW (A) 06/07/2021 1024   APPEARANCEUR CLEAR (A) 06/07/2021 1024   LABSPEC 1.044 (H) 06/07/2021 1024   PHURINE 5.0 06/07/2021 1024   GLUCOSEU NEGATIVE 06/07/2021 1024   HGBUR SMALL (A) 06/07/2021 1024   BILIRUBINUR NEGATIVE 06/07/2021 1024   KETONESUR 20 (A) 06/07/2021 1024   PROTEINUR NEGATIVE 06/07/2021 1024   NITRITE NEGATIVE 06/07/2021 1024  LEUKOCYTESUR NEGATIVE 06/07/2021 1024   Sepsis Labs: @LABRCNTIP (procalcitonin:4,lacticacidven:4)  ) Recent Results (from the past 240 hour(s))  Urine Culture     Status: None   Collection Time: 06/07/21 10:24 AM   Specimen: Urine, Catheterized  Result Value Ref Range Status   Specimen Description   Final    URINE, CATHETERIZED Performed at [facility], [address] [place], [place]    Special Requests   Final    Normal Performed at [facility], [address], [place], [place]    Culture   Final    NO GROWTH Performed at [facility], [address] [place], [place]    Report Status 06/08/2021 FINAL  Final  Resp Panel by RT-PCR (Flu A&B, Covid) Nasopharyngeal Swab     Status: None   Collection Time: 06/07/21  1:01 PM   Specimen: Nasopharyngeal Swab; Nasopharyngeal(NP) swabs in vial transport medium  Result Value Ref Range Status   SARS Coronavirus 2 by RT PCR NEGATIVE NEGATIVE Final    Comment: (NOTE) SARS-CoV-2 target nucleic acids are NOT DETECTED.  The SARS-CoV-2 RNA is generally detectable in upper respiratory specimens during the acute phase of infection. The lowest concentration of SARS-CoV-2 viral copies this assay can detect  is 138 copies/mL. A negative result does not preclude SARS-Cov-2 infection and should not be used as the sole basis for treatment or other patient management decisions. A negative result may occur with  improper specimen collection/handling, submission of specimen other than nasopharyngeal swab, presence of viral mutation(s) within the areas targeted by this assay, and inadequate number of viral copies(<138 copies/mL). A negative result must be combined with clinical observations, patient history, and epidemiological information. The expected result is Negative.  Fact Sheet for Patients:  08/07/21  Fact Sheet for Healthcare Providers:  BloggerCourse.com  This test is no t yet approved or cleared by the [employer].[place] FDA and  has been authorized for detection and/or diagnosis of SARS-CoV-2 by FDA under an Emergency Use Authorization (EUA). This EUA will remain  in effect (meaning this test can be used) for the duration of the COVID-19 declaration under Section 564(b)(1) of the Act, 21 U.S.C.section 360bbb-3(b)(1), unless the authorization is terminated  or revoked sooner.       Influenza A by PCR NEGATIVE NEGATIVE Final   Influenza B by PCR NEGATIVE NEGATIVE Final    Comment: (NOTE) The Xpert Xpress SARS-CoV-2/FLU/RSV plus assay is intended as an aid in the diagnosis of influenza from Nasopharyngeal swab specimens and should not be used as a sole basis for treatment. Nasal washings and aspirates are unacceptable for Xpert Xpress SARS-CoV-2/FLU/RSV testing.  Fact Sheet for Patients: [place]  Fact Sheet for Healthcare Providers: BloggerCourse.com  This test is not yet approved or cleared by the [employer]ious[employer]ker.[employer] FDA and has been authorized for detection and/or diagnosis of SARS-CoV-2 by FDA under an Emergency Use Authorization (EUA). This EUA will remain in effect  (meaning this test can be used) for the duration of the COVID-19 declaration under Section 564(b)(1) of the Act, 21 U.S.C. section 360bbb-3(b)(1), unless the authorization is terminated or revoked.  Performed at [facility], [address], [place], [place]          Radiology Studies: MR BRAIN WO CONTRAST  Result Date: 06/07/2021 CLINICAL DATA:  Acute neurologic deficit EXAM: MRI HEAD WITHOUT CONTRAST TECHNIQUE: Multiplanar, multiecho pulse sequences of the brain and surrounding structures were obtained without intravenous contrast. COMPARISON:  None. FINDINGS: Brain: Acute infarct within the posterior left MCA territory. Multiple falls a of chronic microhemorrhage in the left cerebellum. There is multifocal hyperintense T2-weighted signal within the white matter. Generalized volume loss without a clear lobar predilection. The midline structures are normal. There is an old right cerebellar infarct. Vascular: Major flow voids are preserved. Skull and upper cervical spine: Normal calvarium and skull base. Visualized upper cervical spine and soft tissues are normal. Sinuses/Orbits:No paranasal sinus fluid levels or advanced mucosal thickening. No mastoid or middle ear effusion. Normal orbits. 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 Electronically Signed   By: Deatra RobinsonKevin  Herman M.D.   On: 06/07/2021 21:37   ECHOCARDIOGRAM COMPLETE BUBBLE STUDY  Result Date: 06/07/2021    ECHOCARDIOGRAM REPORT   Patient Name:   Mee HivesBILLY R Tufano Date of Exam: 06/07/2021 Medical Rec #:  [id number]       Height:       73.0 in Accession #:    [id number]      Weight:       190.0 lb Date of Birth:  [birth year]       BSA:          2.105 m Patient Age:    70 years        BP:           166/120 mmHg Patient Gender: M               HR:           127 bpm. Exam Location:  [facility] Procedure: 2D Echo, Cardiac Doppler, Color Doppler and Saline Contrast Bubble             Study Indications:     Stroke 434.91 / I63.9  History:         Patient has no prior history of Echocardiogram examinations.                  Arrythmias:Atrial Fibrillation; Risk Factors:Hypertension.  Sonographer:     Cristela BlueJerry Hege RDCS (AE) Referring Phys:  [id number] [facility]AHAR AMERY Diagnosing Phys: Julien Nordmannimothy Gollan MD  Sonographer Comments: Suboptimal apical window. Image acquisition challenging due to respiratory motion. IMPRESSIONS  1. Left ventricular ejection fraction, by estimation, is 60 to 65%. The left ventricle has normal function. The left ventricle has no regional wall motion abnormalities. Left ventricular diastolic parameters are indeterminate.  2. Right ventricular systolic function is normal. The right ventricular size is normal.  3. The mitral valve is normal in structure. Mild mitral valve regurgitation. No evidence of mitral stenosis.  4. Agitated saline contrast bubble study was negative, with no evidence of any interatrial shunt. FINDINGS  Left Ventricle: Left ventricular ejection fraction, by estimation, is 60 to 65%. The left ventricle has normal function. The left ventricle has no regional wall motion abnormalities. The left ventricular internal cavity size was normal in size. There is  no left ventricular hypertrophy. Left ventricular diastolic parameters are indeterminate. Right Ventricle: The right ventricular size is normal. No increase in right ventricular wall thickness. Right ventricular systolic function is normal. Left Atrium: Left atrial size was normal in size. Right Atrium: Right atrial size was normal in size. Pericardium: There is no evidence of pericardial effusion. Mitral Valve: The mitral valve is normal in structure. Mild mitral valve regurgitation. No evidence of mitral valve stenosis. Tricuspid Valve: The tricuspid valve is normal in structure. Tricuspid valve regurgitation is mild . No evidence of tricuspid stenosis. Aortic  Valve: The aortic valve is normal in structure. Aortic  valve regurgitation is not visualized. No aortic stenosis is present. Aortic valve mean gradient measures 3.0 mmHg. Aortic valve peak gradient measures 4.5 mmHg. Aortic valve area, by VTI measures 2.48 cm. Pulmonic Valve: The pulmonic valve was normal in structure. Pulmonic valve regurgitation is not visualized. No evidence of pulmonic stenosis. Aorta: The aortic root is normal in size and structure. Venous: The inferior vena cava is normal in size with greater than 50% respiratory variability, suggesting right atrial pressure of 3 mmHg. IAS/Shunts: No atrial level shunt detected by color flow Doppler. Agitated saline contrast was given intravenously to evaluate for intracardiac shunting. Agitated saline contrast bubble study was negative, with no evidence of any interatrial shunt.  LEFT VENTRICLE PLAX 2D LVIDd:         3.91 cm LVIDs:         2.53 cm LV PW:         1.20 cm LV IVS:        0.82 cm LVOT diam:     2.00 cm LV SV:         37 LV SV Index:   17 LVOT Area:     3.14 cm  RIGHT VENTRICLE RV Basal diam:  4.12 cm RV S prime:     14.40 cm/s TAPSE (M-mode): 3.3 cm LEFT ATRIUM              Index       RIGHT ATRIUM           Index LA diam:        3.70 cm  1.76 cm/m  RA Area:     23.60 cm LA Vol (A2C):   45.6 ml  21.66 ml/m RA Volume:   88.70 ml  42.13 ml/m LA Vol (A4C):   103.0 ml 48.93 ml/m LA Biplane Vol: 75.4 ml  35.82 ml/m  AORTIC VALVE                   PULMONIC VALVE AV Area (Vmax):    1.95 cm    PV Vmax:        0.44 m/s AV Area (Vmean):   1.92 cm    PV Peak grad:   0.8 mmHg AV Area (VTI):     2.48 cm    RVOT Peak grad: 4 mmHg AV Vmax:           106.00 cm/s AV Vmean:          76.100 cm/s AV VTI:            0.148 m AV Peak Grad:      4.5 mmHg AV Mean Grad:      3.0 mmHg LVOT Vmax:         65.90 cm/s LVOT Vmean:        46.400 cm/s LVOT VTI:          0.117 m LVOT/AV VTI ratio: 0.79  AORTA Ao Root diam: 2.70 cm MITRAL VALVE               TRICUSPID VALVE MV Area (PHT): 5.09 cm    TR Peak grad:   18.3  mmHg MV Decel Time: 149 msec    TR Vmax:        214.00 cm/s MV E velocity: 87.40 cm/s  SHUNTS                            Systemic VTI:  0.12 m                            Systemic Diam: 2.00 cm Julien Nordmann MD Electronically signed by Julien Nordmann MD Signature Date/Time: 06/07/2021/2:18:32 PM    Final    CT ANGIO HEAD NECK W WO CM (CODE STROKE)  Result Date: 06/07/2021 CLINICAL DATA:  Stroke.  Mental status change. EXAM: CT ANGIOGRAPHY HEAD AND NECK TECHNIQUE: Multidetector CT imaging of the head and neck was performed using the standard protocol during bolus administration of intravenous contrast. Multiplanar CT image reconstructions and MIPs were obtained to evaluate the vascular anatomy. Carotid stenosis measurements (when applicable) are obtained utilizing NASCET criteria, using the distal internal carotid diameter as the denominator. CONTRAST:  75mL OMNIPAQUE IOHEXOL 350 MG/ML SOLN COMPARISON:  Carotid Doppler ultrasound 08/01/2020 FINDINGS: CTA NECK FINDINGS Aortic arch: Standard 3 vessel aortic arch with widely patent arch vessel origins. Right carotid system: Patent with a small amount of calcified and soft plaque at the carotid bifurcation. No evidence of a dissection or significant stenosis. Left carotid system: Patent with predominantly soft plaque in the carotid bulb which is mildly ulcerated. No evidence of a significant stenosis or dissection. Vertebral arteries: Patent without evidence of a dissection, stenosis, or significant atherosclerosis. Slightly dominant left vertebral artery. Skeleton: Moderate cervical disc and facet degeneration. Other neck: Bilateral thyroid nodules including an approximately 2.9 cm nodule on the left. Upper chest: Mild centrilobular emphysema. Review of the MIP images confirms the above findings CTA HEAD FINDINGS Anterior circulation: The internal carotid arteries are patent from skull base to carotid termini with mild atherosclerotic plaque  bilaterally and a mild stenosis of the proximal left supraclinoid ICA. ACAs and MCAs are patent without evidence of a proximal branch occlusion or significant proximal stenosis. No aneurysm is identified. Posterior circulation: The intracranial vertebral arteries are widely patent to the basilar. Patent PICA, AICA, and SCA origins are seen bilaterally. The basilar artery is widely patent. There is a diminutive left posterior communicating artery. Both PCAs are patent. Stenoses near the P1-P2 junctions are severe on the right and mild on the left. No aneurysm is identified. Venous sinuses: Patent. Anatomic variants: None. Review of the MIP images confirms the above findings IMPRESSION: 1. No large vessel occlusion. 2. Cervical carotid atherosclerosis including ulcerated plaque in the left carotid bulb. No significant stenosis or dissection. 3. 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 4.  Emphysema (ICD10-J43.9). These results were called by telephone at the time of interpretation on 06/07/2021 at 10:09 am to Dr. Antoine Primas, who verbally acknowledged these results. Electronically Signed   By: Sebastian Ache M.D.   On: 06/07/2021 10:18        Scheduled Meds:  amLODipine  5 mg Oral Daily   aspirin  324 mg Oral Once   aspirin EC  81 mg Oral Daily   atorvastatin  80 mg Oral Daily   metoprolol tartrate  50 mg Oral BID   Continuous Infusions:   LOS: 2 days    Time spent: 25 minutes    Alberteen Sam, MD [facility] 06/09/2021, 8:50 AM     Please page though AMION or Epic secure chat:  For [employer], Higher education careers adviser

## 2021-06-09 NOTE — Progress Notes (Signed)
[facility] Cardiology [facility] Encounter Note  Patient: Darrell Collier / Admit Date: 06/07/2021 / Date of Encounter: 06/09/2021, 7:24 AM   Subjective: 70 year old male with known dilated cardiomyopathy, hypertension, hyperlipidemia, paroxysmal nonvalvular atrial fibrillation.  Patient presented to the ED for symptoms concerning for stroke.  Patient noted to be in atrial fibrillation with RVR on presentation and is rate controlled with metoprolol.  Patient has been on appropriate medication management as an outpatient which included metoprolol, atorvastatin, Eliquis with an unknown last dosage of his Eliquis.  Patient is noted to be confused on presentation today.  There is no current evidence of any chest pain, other cardiovascular symptoms or heart failure related symptoms.  Echocardiogram with bubble study showed normal left ventricular systolic function with an estimated ejection fraction of 60-65%.  Negative bubble study with no evidence of interatrial shunt.  Brain MRI showed acute infarct within the posterior left MCA territory without hemorrhagic effect.  Review of Systems: Unable to assess due to patient's confusion  Objective: Telemetry: Atrial fibrillation at a ventricular rate of approximately 100 bpm Physical Exam: Blood pressure (!) 152/107, pulse 71, temperature 98.4 F (36.9 C), temperature source Oral, resp. rate 16, height  (1.854 m), weight 88.6 kg, SpO2 96 %. Body mass index is 25.77 kg/m. General: Well developed, well nourished, in no acute distress. Head: Normocephalic, atraumatic, sclera non-icteric, no xanthomas, nares are without discharge. Neck: No apparent masses Lungs: Normal respirations with no wheezes, no rhonchi, no rales , no crackles   Heart: Regular rate and rhythm, normal S1 S2, no murmur, no rub, no gallop, PMI is normal size and placement, carotid upstroke normal without bruit, jugular venous pressure normal Abdomen: Soft, non-tender, non-distended  with normoactive bowel sounds. No hepatosplenomegaly. Abdominal aorta is normal size without bruit Extremities: No edema, no clubbing, no cyanosis, no ulcers,  Peripheral: 2+ radial, 2+ femoral, 2+ dorsal pedal pulses Neuro: Alert and oriented. Moves all extremities spontaneously. Psych: Does not respond to questions appropriately  No intake or output data in the 24 hours ending 06/09/21 0724   Inpatient Medications:   amLODipine  5 mg Oral Daily   aspirin  324 mg Oral Once   aspirin EC  81 mg Oral Daily   atorvastatin  80 mg Oral Daily   metoprolol tartrate  50 mg Oral BID   Infusions:   Labs: Recent Labs    06/07/21 0845  NA 140  K 3.6  CL 104  CO2 23  GLUCOSE 83  BUN 26*  CREATININE 1.46*  CALCIUM 9.0  MG 1.7    Recent Labs    06/07/21 0845  AST 21  ALT 12  ALKPHOS 89  BILITOT 1.7*  PROT 7.7  ALBUMIN 3.8    Recent Labs    06/07/21 0845  WBC 8.5  NEUTROABS 5.5  HGB 14.4  HCT 42.4  MCV 90.4  PLT 178    No results for input(s): CKTOTAL, CKMB, TROPONINI in the last 72 hours. Invalid input(s): POCBNP Recent Labs    06/08/21 0415  HGBA1C 5.5      Weights: Filed Weights   06/07/21 0841 06/07/21 2239  Weight: 86.2 kg 88.6 kg     Radiology/Studies:  CT HEAD WO CONTRAST  Result Date: 06/07/2021 CLINICAL DATA:  Altered mental status EXAM: CT HEAD WITHOUT CONTRAST TECHNIQUE: Contiguous axial images were obtained from the base of the skull through the vertex without intravenous contrast. COMPARISON:  None. FINDINGS: Brain: Subtle low-density noted in the posterior left temporal  lobe concerning for early acute infarction. No hemorrhage. Chronic small vessel disease throughout the deep white matter. 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 Vascular: No hyperdense vessel or unexpected calcification. Skull: No acute calvarial abnormality. Sinuses/Orbits: Visualized paranasal sinuses and mastoids clear. Orbital soft tissues unremarkable. Other: None  IMPRESSION: Subtle low-density in the posterior left temporal lobe concerning for early acute infarction. This could be further evaluated with MRI if felt clinically indicated. 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 Chronic small vessel disease. Electronically Signed   By: Charlett Nose M.D.   On: 06/07/2021 09:01   MR BRAIN WO CONTRAST  Result Date: 06/07/2021 CLINICAL DATA:  Acute neurologic deficit EXAM: MRI HEAD WITHOUT CONTRAST TECHNIQUE: Multiplanar, multiecho pulse sequences of the brain and surrounding structures were obtained without intravenous contrast. COMPARISON:  None. FINDINGS: Brain: Acute infarct within the posterior left MCA territory. Multiple falls a of chronic microhemorrhage in the left cerebellum. There is multifocal hyperintense T2-weighted signal within the white matter. Generalized volume loss without a clear lobar predilection. The midline structures are normal. There is an old right cerebellar infarct. Vascular: Major flow voids are preserved. Skull and upper cervical spine: Normal calvarium and skull base. Visualized upper cervical spine and soft tissues are normal. Sinuses/Orbits:No paranasal sinus fluid levels or advanced mucosal thickening. No mastoid or middle ear effusion. Normal orbits. 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 Electronically Signed   By: Deatra Robinson M.D.   On: 06/07/2021 21:37   ECHOCARDIOGRAM COMPLETE BUBBLE STUDY  Result Date: 06/07/2021    ECHOCARDIOGRAM REPORT   Patient Name:   Darrell Collier Date of Exam: 06/07/2021 Medical Rec #:  [id number]       Height:       73.0 in Accession #:    [id number]      Weight:       190.0 lb Date of Birth:  02-Sep-1951       BSA:          2.105 m Patient Age:    70 years        BP:           166/120 mmHg Patient Gender: M               HR:           127 bpm. Exam Location:  [facility] Procedure: 2D Echo, Cardiac Doppler, Color Doppler and Saline Contrast Bubble             Study Indications:     Stroke 434.91 / I63.9  History:         Patient has no prior history of Echocardiogram examinations.                  Arrythmias:Atrial Fibrillation; Risk Factors:Hypertension.  Sonographer:     [name] RDCS (AE) Referring Phys:  [id number] [facility] Diagnosing Phys: Julien Nordmann MD  Sonographer Comments: Suboptimal apical window. Image acquisition challenging due to respiratory motion. IMPRESSIONS  1. Left ventricular ejection fraction, by estimation, is 60 to 65%. The left ventricle has normal function. The left ventricle has no regional wall motion abnormalities. Left ventricular diastolic parameters are indeterminate.  2. Right ventricular systolic function is normal. The right ventricular size is normal.  3. The mitral valve is normal in structure. Mild mitral valve regurgitation. No evidence of mitral stenosis.  4. Agitated saline contrast bubble study was negative, with no evidence of any interatrial shunt. FINDINGS  Left Ventricle: Left ventricular ejection fraction, by estimation, is 60 to 65%. The left ventricle has normal function. The left ventricle has no regional wall motion abnormalities. The left ventricular internal cavity size was normal in size. There is  no left ventricular hypertrophy. Left ventricular diastolic parameters are indeterminate. Right Ventricle: The right ventricular size is normal. No increase in right ventricular wall thickness. Right ventricular systolic function is normal. Left Atrium: Left atrial size was normal in size. Right Atrium: Right atrial size was normal in size. Pericardium: There is no evidence of pericardial effusion. Mitral Valve: The mitral valve is normal in structure. Mild mitral valve regurgitation. No evidence of mitral valve stenosis. Tricuspid Valve: The tricuspid valve is normal in structure. Tricuspid valve regurgitation is mild . No evidence of tricuspid stenosis. Aortic Valve: The aortic valve is normal in structure. Aortic  valve regurgitation is not visualized. No aortic stenosis is present. Aortic valve mean gradient measures 3.0 mmHg. Aortic valve peak gradient measures 4.5 mmHg. Aortic valve area, by VTI measures 2.48 cm. Pulmonic Valve: The pulmonic valve was normal in structure. Pulmonic valve regurgitation is not visualized. No evidence of pulmonic stenosis. Aorta: The aortic root is normal in size and structure. Venous: The inferior vena cava is normal in size with greater than 50% respiratory variability, suggesting right atrial pressure of 3 mmHg. IAS/Shunts: No atrial level shunt detected by color flow Doppler. Agitated saline contrast was given intravenously to evaluate for intracardiac shunting. Agitated saline contrast bubble study was negative, with no evidence of any interatrial shunt.  LEFT VENTRICLE PLAX 2D LVIDd:         3.91 cm LVIDs:         2.53 cm LV PW:         1.20 cm LV IVS:        0.82 cm LVOT diam:     2.00 cm LV SV:         37 LV SV Index:   17 LVOT Area:     3.14 cm  RIGHT VENTRICLE RV Basal diam:  4.12 cm RV S prime:     14.40 cm/s TAPSE (M-mode): 3.3 cm LEFT ATRIUM              Index       RIGHT ATRIUM           Index LA diam:        3.70 cm  1.76 cm/m  RA Area:     23.60 cm LA Vol (A2C):   45.6 ml  21.66 ml/m RA Volume:   88.70 ml  42.13 ml/m LA Vol (A4C):   103.0 ml 48.93 ml/m LA Biplane Vol: 75.4 ml  35.82 ml/m  AORTIC VALVE                   PULMONIC VALVE AV Area (Vmax):    1.95 cm    PV Vmax:        0.44 m/s AV Area (Vmean):   1.92 cm    PV Peak grad:   0.8 mmHg AV Area (VTI):     2.48 cm    RVOT Peak grad: 4 mmHg AV Vmax:           106.00 cm/s AV Vmean:          76.100 cm/s AV VTI:            0.148 m AV Peak Grad:      4.5 mmHg AV Mean Grad:  3.0 mmHg LVOT Vmax:         65.90 cm/s LVOT Vmean:        46.400 cm/s LVOT VTI:          0.117 m LVOT/AV VTI ratio: 0.79  AORTA Ao Root diam: 2.70 cm MITRAL VALVE               TRICUSPID VALVE MV Area (PHT): 5.09 cm    TR Peak grad:   18.3  mmHg MV Decel Time: 149 msec    TR Vmax:        214.00 cm/s MV E velocity: 87.40 cm/s                            SHUNTS                            Systemic VTI:  0.12 m                            Systemic Diam: 2.00 cm Julien Nordmann MD Electronically signed by Julien Nordmann MD Signature Date/Time: 06/07/2021/2:18:32 PM    Final    CT ANGIO HEAD NECK W WO CM (CODE STROKE)  Result Date: 06/07/2021 CLINICAL DATA:  Stroke.  Mental status change. EXAM: CT ANGIOGRAPHY HEAD AND NECK TECHNIQUE: Multidetector CT imaging of the head and neck was performed using the standard protocol during bolus administration of intravenous contrast. Multiplanar CT image reconstructions and MIPs were obtained to evaluate the vascular anatomy. Carotid stenosis measurements (when applicable) are obtained utilizing NASCET criteria, using the distal internal carotid diameter as the denominator. CONTRAST:  105mL OMNIPAQUE IOHEXOL 350 MG/ML SOLN COMPARISON:  Carotid Doppler ultrasound 08/01/2020 FINDINGS: CTA NECK FINDINGS Aortic arch: Standard 3 vessel aortic arch with widely patent arch vessel origins. Right carotid system: Patent with a small amount of calcified and soft plaque at the carotid bifurcation. No evidence of a dissection or significant stenosis. Left carotid system: Patent with predominantly soft plaque in the carotid bulb which is mildly ulcerated. No evidence of a significant stenosis or dissection. Vertebral arteries: Patent without evidence of a dissection, stenosis, or significant atherosclerosis. Slightly dominant left vertebral artery. Skeleton: Moderate cervical disc and facet degeneration. Other neck: Bilateral thyroid nodules including an approximately 2.9 cm nodule on the left. Upper chest: Mild centrilobular emphysema. Review of the MIP images confirms the above findings CTA HEAD FINDINGS Anterior circulation: The internal carotid arteries are patent from skull base to carotid termini with mild atherosclerotic plaque  bilaterally and a mild stenosis of the proximal left supraclinoid ICA. ACAs and MCAs are patent without evidence of a proximal branch occlusion or significant proximal stenosis. No aneurysm is identified. Posterior circulation: The intracranial vertebral arteries are widely patent to the basilar. Patent PICA, AICA, and SCA origins are seen bilaterally. The basilar artery is widely patent. There is a diminutive left posterior communicating artery. Both PCAs are patent. Stenoses near the P1-P2 junctions are severe on the right and mild on the left. No aneurysm is identified. Venous sinuses: Patent. Anatomic variants: None. Review of the MIP images confirms the above findings IMPRESSION: 1. No large vessel occlusion. 2. Cervical carotid atherosclerosis including ulcerated plaque in the left carotid bulb. No significant stenosis or dissection. 3. 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 4.  Emphysema (ICD10-J43.9). These results were called by telephone at the  time of interpretation on 06/07/2021 at 10:09 am to Dr. Antoine PrimasZachary Smith, who verbally acknowledged these results. Electronically Signed   By: Sebastian AcheAllen  Grady M.D.   On: 06/07/2021 10:18     Assessment and Recommendation  70 y.o. male with a past medical history of hypertension, hyperlipidemia, paroxysmal nonvalvular atrial fibrillation who presented with rapid ventricular response with significant stroke symptoms.  MRI showed no evidence of hemorrhagic effect and echocardiogram showed no evidence of left ventricular systolic dysfunction or valvular heart disease and no evidence of interatrial shunt 1.  Continuation of anticoagulation for further risk reduction stroke with atrial fibrillation including Eliquis 5 mg twice per day 2.  Antiplatelet for peripheral vascular disease and stroke 3.  No further cardiac diagnostics necessary at this time 4.  Current dosage of metoprolol appears to be working fairly well for heart rate control and no change today 5.   Continue amlodipine for hypertension control and would consider addition of angiotensin receptor blocker for goal blood pressure slowly after stroke at 140 mm 6.  Begin ambulation and rehabilitation for about  Signed, Maralyn Sagomanda Tobin PA-C

## 2021-06-09 NOTE — TOC Progression Note (Signed)
Transition of Care [facility]) - Progression Note    Patient Details  Name: Darrell Collier MRN: [id number] Date of Birth: 1951-04-18  Transition of Care [facility]) CM/SW Contact  Liliana Cline, LCSW Phone Number: 06/09/2021, 1:06 PM  Clinical Narrative:   Left a VM for Clydie Braun (DIL) requesting a return call to confirm DC plans.    Expected Discharge Plan: IP Rehab Facility Barriers to Discharge: Continued Medical Work up  Expected Discharge Plan and Services Expected Discharge Plan: IP Rehab Facility   Discharge Planning Services: CM Consult Post Acute Care Choice: IP Rehab Living arrangements for the past 2 months: Single Family Home                 DME Arranged: N/A DME Agency: NA                   Social Determinants of Health (SDOH) Interventions    Readmission Risk Interventions No flowsheet data found.

## 2021-06-09 NOTE — Plan of Care (Signed)
I contacted patients dad, per his request.  He was very concerned about his animals and home and wanted to make sure someone was taking care of things.  Dad assured me that patient's home and animals are being cared for by his daughter - son should visit in the hospital later today and dad plans to visit tomorrow.  Information shared with patient.

## 2021-06-09 NOTE — Plan of Care (Signed)
Neurology Plan of Care  Stroke workup is complete. Recommend repeat head CT tomorrow to rule out interval hemorrhagic conversion. If no blood on CT, restart eliquis. Aspirin may be discontinued when eliquis is restarted unless there is a cardiovascular indication for antiplatelet in addition to therapeutic anticoagulation.  Neurology to sign off, but please re-engage if new neurologic concerns arise.  [name], MD [facility] [phone]  If 7pm- 7am, please page neurology on call as listed in AMION.

## 2021-06-09 NOTE — Plan of Care (Signed)
Per Tele monitoring request - Dr. Truitt Merle of HR climbing to 130s+.  Dr. Eather Colas that this is not baseline, but metoprolol dose has already been increased.  Requesting to just observe at this time.  Patient asymptomatic.

## 2021-06-09 NOTE — Progress Notes (Signed)
Initial Nutrition Assessment RD working remotely.  DOCUMENTATION CODES:   Not applicable  INTERVENTION:  - will Will order 30 ml Prosource Plus BID, each supplement provides 100 kcal and 15 grams protein.  - will order 1 tablet multivitamin with minerals/day. - complete NFPE when feasible.    NUTRITION DIAGNOSIS:   Increased nutrient needs related to acute illness as evidenced by estimated needs.  GOAL:   Patient will meet greater than or equal to 90% of their needs  MONITOR:   PO intake, Supplement acceptance, Labs, Weight trends  REASON FOR ASSESSMENT:   Malnutrition Screening Tool  ASSESSMENT:   70 y.o. male with medical history of HTN and A. Fib on Eliquis. He presented to the ED with aphasia; he had presented to his PCP's office talking unintelligibly and was sent to the ED from there. He was admitted for a stroke.  Patient is noted to be unable to follow commands and to have expressive aphasia with delayed responses. Noted to be disoriented to time and situation.   He has been eating 100% at meals yesterday and today on Heart Healthy diet.   Weight on 8/4 was 195 lb and PTA the most recently documented weight was on 08/25/20 when he weighed 212 lb. This indicates 17 lb weight loss (8% body weight) in the past 9.5 months; not significant for time frame.   Per notes: - acute L-sided MCA stroke - aphasia--improving - afib with RVR - stage 3 CKD   Labs reviewed; BUN: 26 mg/dl, creatinine: 3.01 mg/dl, GFR: 52 ml/min.  Medications reviewed.     NUTRITION - FOCUSED PHYSICAL EXAM:  Unable to complete at this time.   Diet Order:   Diet Order             Diet Heart Room service appropriate? Yes; Fluid consistency: Thin  Diet effective now                   EDUCATION NEEDS:   No education needs have been identified at this time  Skin:  Skin Assessment: Reviewed RN Assessment  Last BM:  PTA/unknown  Height:   Ht Readings from Last 1 Encounters:   06/07/21 6\' 1"  (1.854 m)    Weight:   Wt Readings from Last 1 Encounters:  06/07/21 88.6 kg      Estimated Nutritional Needs:  Kcal:  1950-2150 kcal Protein:  95-105 grams Fluid:  >/= 2.2 L/day       08/07/21, MS, RD, LDN, CNSC Inpatient Clinical Dietitian RD pager # available in AMION  After hours/weekend pager # available in [facility]

## 2021-06-09 NOTE — Progress Notes (Signed)
Inpatient Rehab Admissions Coordinator:   I spoke with Pt., Pt.'s son, and  Pt.'s father about potential CIR admit. They state interest but are not sure family will be able to work out 24/7 support. I will open a case with Pt's insurance, but I am not able to accept Pt. Unless family is able to work out  a plan for 24/7 support as Pt. Will likely be unsafe to return home alone due to cognitive-linguistic deficits and the fact that Pt.'s insurance will  not pay for SNF after CIR.   Megan Salon, MS, CCC-SLP Rehab Admissions Coordinator  [phone] (celll) [phone] (office)

## 2021-06-10 ENCOUNTER — Other Ambulatory Visit: Payer: Self-pay

## 2021-06-10 ENCOUNTER — Inpatient Hospital Stay: Payer: Medicare HMO

## 2021-06-10 MED ORDER — METOPROLOL SUCCINATE ER 50 MG PO TB24: 50.0000 mg | ORAL_TABLET | Freq: Two times a day (BID) | ORAL | 3 refills | Status: AC

## 2021-06-10 MED ORDER — ATORVASTATIN CALCIUM 10 MG PO TABS: 10.0000 mg | ORAL_TABLET | Freq: Every day | ORAL | 3 refills | Status: DC

## 2021-06-10 MED ORDER — APIXABAN 5 MG PO TABS
5.0000 mg | ORAL_TABLET | Freq: Two times a day (BID) | ORAL | Status: DC
  Administered 2021-06-10 – 2021-06-14 (×9): 5 mg via ORAL
  Filled 2021-06-10 (×9): qty 1

## 2021-06-10 MED ORDER — APIXABAN 5 MG PO TABS: 5.0000 mg | ORAL_TABLET | Freq: Two times a day (BID) | ORAL | 11 refills | Status: AC

## 2021-06-10 MED ORDER — AMLODIPINE BESYLATE 5 MG PO TABS: 5.0000 mg | ORAL_TABLET | Freq: Every day | ORAL | 3 refills | Status: AC

## 2021-06-10 NOTE — Progress Notes (Signed)
CCMD called with tele report:  [id number] with RVR  -nothing follows

## 2021-06-10 NOTE — TOC Progression Note (Addendum)
Transition of Care [facility]) - Progression Note    Patient Details  Name: Darrell Collier MRN: [id number] Date of Birth: 1950-12-16  Transition of Care [facility]) CM/SW Contact  Liliana Cline, LCSW Phone Number: 06/10/2021, 9:51 AM  Clinical Narrative:   Attempted to speak to patient about rehab versus home health. Patient seemed confused. Spoke with both patient's father and son via phone. Patient's son Darrell Collier stated he feels patient needs rehab. He is not sure patient would agree to home health and said they do not have anyone that could consistently stay with or check on patient at home at this time.   10:15- Starting SNF workup. Patient agreeable in conversation with MD.    3:15- Notified by RN that patient's father requesting SNF in [facility].    Expected Discharge Plan: IP Rehab Facility Barriers to Discharge: Continued Medical Work up  Expected Discharge Plan and Services Expected Discharge Plan: IP Rehab Facility   Discharge Planning Services: CM Consult Post Acute Care Choice: IP Rehab Living arrangements for the past 2 months: Single Family Home Expected Discharge Date: 06/10/21               DME Arranged: N/A DME Agency: NA                   Social Determinants of Health (SDOH) Interventions    Readmission Risk Interventions No flowsheet data found.

## 2021-06-10 NOTE — NC FL2 (Signed)
[place] MEDICAID FL2 LEVEL OF CARE SCREENING TOOL     IDENTIFICATION  Patient Name: Darrell Collier Birthdate: 05/19/1951 Sex: male Admission Date (Current Location): 06/07/2021  [facility] and [place] Number:  Chiropodist and Address:  [facility], [address] [place], [place] [address]      Provider Number: [id number]  Attending Physician Name and Address:  Alberteen Sam, *  Relative Name and Phone Number:  [name] - [phone]    Current Level of Care: Hospital Recommended Level of Care: Skilled Nursing Facility Prior Approval Number:    Date Approved/Denied:   PASRR Number: [id number]  Discharge Plan:      Current Diagnoses: Patient Active Problem List   Diagnosis Date Noted   Stroke (cerebrum) (HCC) 06/07/2021   Atrial fibrillation with RVR (HCC) 10/19/2019   Essential hypertension 10/19/2019   Atherosclerosis of native arteries of the extremities with gangrene (HCC) 10/19/2019   Dilated cardiomyopathy (HCC) 06/17/2019   Medicare annual wellness visit, initial 02/11/2019    Orientation RESPIRATION BLADDER Height & Weight     Self, Time  Normal Continent Weight: 195 lb 5.2 oz (88.6 kg) Height:  6\' 1"  (185.4 cm)  BEHAVIORAL SYMPTOMS/MOOD NEUROLOGICAL BOWEL NUTRITION STATUS         (heart; thin liquids)  AMBULATORY STATUS COMMUNICATION OF NEEDS Skin   Limited Assist Verbally Other (Comment) (dry)                       Personal Care Assistance Level of Assistance  Bathing, Feeding, Dressing Bathing Assistance: Limited assistance Feeding assistance: Limited assistance Dressing Assistance: Limited assistance     Functional Limitations Info             SPECIAL CARE FACTORS FREQUENCY  PT (By licensed PT), OT (By licensed OT)     PT Frequency: 5 x/week OT Frequency: 5 x/week            Contractures      Additional Factors Info  Code Status, Allergies Code Status Info: full Allergies Info:  nka           Current Medications (06/10/2021):  This is the current hospital active medication list Current Facility-Administered Medications  Medication Dose Route Frequency Provider Last Rate Last Admin   (feeding supplement) PROSource Plus liquid 30 mL  30 mL Oral BID BM Danford, 08/10/2021, MD       acetaminophen (TYLENOL) tablet 650 mg  650 mg Oral Q4H PRN Earl Lites, MD       Or   acetaminophen (TYLENOL) 160 MG/5ML solution 650 mg  650 mg Per Tube Q4H PRN Lynn Ito, MD       Or   acetaminophen (TYLENOL) suppository 650 mg  650 mg Rectal Q4H PRN Lynn Ito, MD       amLODipine (NORVASC) tablet 5 mg  5 mg Oral Daily Danford, Lynn Ito, MD   5 mg at 06/10/21 1011   apixaban (ELIQUIS) tablet 5 mg  5 mg Oral BID 08/10/21, MD   5 mg at 06/10/21 1011   aspirin chewable tablet 324 mg  324 mg Oral Once 08/10/21, MD       atorvastatin (LIPITOR) tablet 80 mg  80 mg Oral Daily Gilles Chiquito, MD   80 mg at 06/10/21 1012   labetalol (NORMODYNE) injection 10 mg  10 mg Intravenous Q2H PRN 08/10/21, MD   10 mg at 06/07/21 1414   metoprolol  tartrate (LOPRESSOR) tablet 50 mg  50 mg Oral BID Lamar Blinks, MD   50 mg at 06/10/21 1011   multivitamin with minerals tablet 1 tablet  1 tablet Oral Daily Danford, Earl Lites, MD   1 tablet at 06/10/21 1011   senna-docusate (Senokot-S) tablet 1 tablet  1 tablet Oral QHS PRN Lynn Ito, MD         Discharge Medications: Please see discharge summary for a list of discharge medications.  Relevant Imaging Results:  Relevant Lab Results:   Additional Information SS #: [id number]  Julaine Zimny E Derrek Puff, LCSW

## 2021-06-10 NOTE — Progress Notes (Signed)
[facility] Encounter Note  Patient: Darrell Collier / Admit Date: 06/07/2021 / Date of Encounter: 06/10/2021, 7:56 AM   Subjective: 70 year old male with known dilated cardiomyopathy, hypertension, hyperlipidemia, paroxysmal nonvalvular atrial fibrillation.  Patient presented to the ED for symptoms concerning for stroke.  Patient noted to be in atrial fibrillation with RVR on presentation and is rate controlled with metoprolol.  Patient has been on appropriate medication management as an outpatient which included metoprolol, atorvastatin, Eliquis with an unknown last dosage of his Eliquis.  Patient is noted to be confused on presentation today.  There is no current evidence of any chest pain, other cardiovascular symptoms or heart failure related symptoms.  Echocardiogram with bubble study showed normal left ventricular systolic function with an estimated ejection fraction of 60-65%.  Negative bubble study with no evidence of interatrial shunt.  Brain MRI showed acute infarct within the posterior left MCA territory without hemorrhagic effect.  Review of Systems: Unable to assess due to patient's confusion  Objective: Telemetry: Atrial fibrillation at a ventricular rate of approximately 100 bpm Physical Exam: Blood pressure (!) 140/98, pulse 67, temperature 98 F (36.7 C), temperature source Oral, resp. rate 16, height 6\' 1"  (1.854 m), weight 88.6 kg, SpO2 100 %. Body mass index is 25.77 kg/m. General: Well developed, well nourished, in no acute distress. Head: Normocephalic, atraumatic, sclera non-icteric, no xanthomas, nares are without discharge. Neck: No apparent masses Lungs: Normal respirations with no wheezes, no rhonchi, no rales , no crackles   Heart: Regular rate and rhythm, normal S1 S2, no murmur, no rub, no gallop, PMI is normal size and placement, carotid upstroke normal without bruit, jugular venous pressure normal Abdomen: Soft, non-tender,  non-distended with normoactive bowel sounds. No hepatosplenomegaly. Abdominal aorta is normal size without bruit Extremities: No edema, no clubbing, no cyanosis, no ulcers,  Peripheral: 2+ radial, 2+ femoral, 2+ dorsal pedal pulses Neuro: Alert and oriented. Moves all extremities spontaneously. Psych: Does not respond to questions appropriately   Intake/Output Summary (Last 24 hours) at 06/10/2021 0756 Last data filed at 06/09/2021 1300 Gross per 24 hour  Intake 120 ml  Output --  Net 120 ml     Inpatient Medications:   (feeding supplement) PROSource Plus  30 mL Oral BID BM   amLODipine  5 mg Oral Daily   aspirin  324 mg Oral Once   aspirin EC  81 mg Oral Daily   atorvastatin  80 mg Oral Daily   metoprolol tartrate  50 mg Oral BID   multivitamin with minerals  1 tablet Oral Daily   Infusions:   Labs: Recent Labs    06/07/21 0845  NA 140  K 3.6  CL 104  CO2 23  GLUCOSE 83  BUN 26*  CREATININE 1.46*  CALCIUM 9.0  MG 1.7    Recent Labs    06/07/21 0845  AST 21  ALT 12  ALKPHOS 89  BILITOT 1.7*  PROT 7.7  ALBUMIN 3.8    Recent Labs    06/07/21 0845  WBC 8.5  NEUTROABS 5.5  HGB 14.4  HCT 42.4  MCV 90.4  PLT 178    No results for input(s): CKTOTAL, CKMB, TROPONINI in the last 72 hours. Invalid input(s): POCBNP Recent Labs    06/08/21 0415  HGBA1C 5.5      Weights: Filed Weights   06/07/21 0841 06/07/21 2239  Weight: 86.2 kg 88.6 kg     Radiology/Studies:  CT HEAD WO CONTRAST  Result Date: 06/07/2021  CLINICAL DATA:  Altered mental status EXAM: CT HEAD WITHOUT CONTRAST TECHNIQUE: Contiguous axial images were obtained from the base of the skull through the vertex without intravenous contrast. COMPARISON:  None. FINDINGS: Brain: Subtle low-density noted in the posterior left temporal lobe concerning for early acute infarction. No hemorrhage. Chronic small vessel disease throughout the deep white matter. 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  Vascular: No hyperdense vessel or unexpected calcification. Skull: No acute calvarial abnormality. Sinuses/Orbits: Visualized paranasal sinuses and mastoids clear. Orbital soft tissues unremarkable. Other: None IMPRESSION: Subtle low-density in the posterior left temporal lobe concerning for early acute infarction. This could be further evaluated with MRI if felt clinically indicated. 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 Chronic small vessel disease. Electronically Signed   By: Charlett Nose M.D.   On: 06/07/2021 09:01   MR BRAIN WO CONTRAST  Result Date: 06/07/2021 CLINICAL DATA:  Acute neurologic deficit EXAM: MRI HEAD WITHOUT CONTRAST TECHNIQUE: Multiplanar, multiecho pulse sequences of the brain and surrounding structures were obtained without intravenous contrast. COMPARISON:  None. FINDINGS: Brain: Acute infarct within the posterior left MCA territory. Multiple falls a of chronic microhemorrhage in the left cerebellum. There is multifocal hyperintense T2-weighted signal within the white matter. Generalized volume loss without a clear lobar predilection. The midline structures are normal. There is an old right cerebellar infarct. Vascular: Major flow voids are preserved. Skull and upper cervical spine: Normal calvarium and skull base. Visualized upper cervical spine and soft tissues are normal. Sinuses/Orbits:No paranasal sinus fluid levels or advanced mucosal thickening. No mastoid or middle ear effusion. Normal orbits. 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 Electronically Signed   By: Deatra Robinson M.D.   On: 06/07/2021 21:37   ECHOCARDIOGRAM COMPLETE BUBBLE STUDY  Result Date: 06/07/2021    ECHOCARDIOGRAM REPORT   Patient Name:   Darrell Collier Date of Exam: 06/07/2021 Medical Rec #:  [id number]       Height:       73.0 in Accession #:    [id number]      Weight:       190.0 lb Date of Birth:  1951-01-26       BSA:          2.105 m Patient  Age:    70 years        BP:           166/120 mmHg Patient Gender: M               HR:           127 bpm. Exam Location:  [facility] Procedure: 2D Echo, Cardiac Doppler, Color Doppler and Saline Contrast Bubble            Study Indications:     Stroke 434.91 / I63.9  History:         Patient has no prior history of Echocardiogram examinations.                  Arrythmias:Atrial Fibrillation; Risk Factors:Hypertension.  Sonographer:     [name] RDCS (AE) Referring Phys:  [id number] [facility] Diagnosing Phys: Julien Nordmann MD  Sonographer Comments: Suboptimal apical window. Image acquisition challenging due to respiratory motion. IMPRESSIONS  1. Left ventricular ejection fraction, by estimation, is 60 to 65%. The left ventricle has normal function. The left ventricle has no regional wall motion abnormalities. Left ventricular diastolic parameters are indeterminate.  2. Right ventricular systolic function is normal.  The right ventricular size is normal.  3. The mitral valve is normal in structure. Mild mitral valve regurgitation. No evidence of mitral stenosis.  4. Agitated saline contrast bubble study was negative, with no evidence of any interatrial shunt. FINDINGS  Left Ventricle: Left ventricular ejection fraction, by estimation, is 60 to 65%. The left ventricle has normal function. The left ventricle has no regional wall motion abnormalities. The left ventricular internal cavity size was normal in size. There is  no left ventricular hypertrophy. Left ventricular diastolic parameters are indeterminate. Right Ventricle: The right ventricular size is normal. No increase in right ventricular wall thickness. Right ventricular systolic function is normal. Left Atrium: Left atrial size was normal in size. Right Atrium: Right atrial size was normal in size. Pericardium: There is no evidence of pericardial effusion. Mitral Valve: The mitral valve is normal in structure. Mild mitral valve regurgitation. No evidence of mitral  valve stenosis. Tricuspid Valve: The tricuspid valve is normal in structure. Tricuspid valve regurgitation is mild . No evidence of tricuspid stenosis. Aortic Valve: The aortic valve is normal in structure. Aortic valve regurgitation is not visualized. No aortic stenosis is present. Aortic valve mean gradient measures 3.0 mmHg. Aortic valve peak gradient measures 4.5 mmHg. Aortic valve area, by VTI measures 2.48 cm. Pulmonic Valve: The pulmonic valve was normal in structure. Pulmonic valve regurgitation is not visualized. No evidence of pulmonic stenosis. Aorta: The aortic root is normal in size and structure. Venous: The inferior vena cava is normal in size with greater than 50% respiratory variability, suggesting right atrial pressure of 3 mmHg. IAS/Shunts: No atrial level shunt detected by color flow Doppler. Agitated saline contrast was given intravenously to evaluate for intracardiac shunting. Agitated saline contrast bubble study was negative, with no evidence of any interatrial shunt.  LEFT VENTRICLE PLAX 2D LVIDd:         3.91 cm LVIDs:         2.53 cm LV PW:         1.20 cm LV IVS:        0.82 cm LVOT diam:     2.00 cm LV SV:         37 LV SV Index:   17 LVOT Area:     3.14 cm  RIGHT VENTRICLE RV Basal diam:  4.12 cm RV S prime:     14.40 cm/s TAPSE (M-mode): 3.3 cm LEFT ATRIUM              Index       RIGHT ATRIUM           Index LA diam:        3.70 cm  1.76 cm/m  RA Area:     23.60 cm LA Vol (A2C):   45.6 ml  21.66 ml/m RA Volume:   88.70 ml  42.13 ml/m LA Vol (A4C):   103.0 ml 48.93 ml/m LA Biplane Vol: 75.4 ml  35.82 ml/m  AORTIC VALVE                   PULMONIC VALVE AV Area (Vmax):    1.95 cm    PV Vmax:        0.44 m/s AV Area (Vmean):   1.92 cm    PV Peak grad:   0.8 mmHg AV Area (VTI):     2.48 cm    RVOT Peak grad: 4 mmHg AV Vmax:           106.00 cm/s  AV Vmean:          76.100 cm/s AV VTI:            0.148 m AV Peak Grad:      4.5 mmHg AV Mean Grad:      3.0 mmHg LVOT Vmax:          65.90 cm/s LVOT Vmean:        46.400 cm/s LVOT VTI:          0.117 m LVOT/AV VTI ratio: 0.79  AORTA Ao Root diam: 2.70 cm MITRAL VALVE               TRICUSPID VALVE MV Area (PHT): 5.09 cm    TR Peak grad:   18.3 mmHg MV Decel Time: 149 msec    TR Vmax:        214.00 cm/s MV E velocity: 87.40 cm/s                            SHUNTS                            Systemic VTI:  0.12 m                            Systemic Diam: 2.00 cm Julien Nordmannimothy Gollan MD Electronically signed by Julien Nordmannimothy Gollan MD Signature Date/Time: 06/07/2021/2:18:32 PM    Final    CT ANGIO HEAD NECK W WO CM (CODE STROKE)  Result Date: 06/07/2021 CLINICAL DATA:  Stroke.  Mental status change. EXAM: CT ANGIOGRAPHY HEAD AND NECK TECHNIQUE: Multidetector CT imaging of the head and neck was performed using the standard protocol during bolus administration of intravenous contrast. Multiplanar CT image reconstructions and MIPs were obtained to evaluate the vascular anatomy. Carotid stenosis measurements (when applicable) are obtained utilizing NASCET criteria, using the distal internal carotid diameter as the denominator. CONTRAST:  75mL OMNIPAQUE IOHEXOL 350 MG/ML SOLN COMPARISON:  Carotid Doppler ultrasound 08/01/2020 FINDINGS: CTA NECK FINDINGS Aortic arch: Standard 3 vessel aortic arch with widely patent arch vessel origins. Right carotid system: Patent with a small amount of calcified and soft plaque at the carotid bifurcation. No evidence of a dissection or significant stenosis. Left carotid system: Patent with predominantly soft plaque in the carotid bulb which is mildly ulcerated. No evidence of a significant stenosis or dissection. Vertebral arteries: Patent without evidence of a dissection, stenosis, or significant atherosclerosis. Slightly dominant left vertebral artery. Skeleton: Moderate cervical disc and facet degeneration. Other neck: Bilateral thyroid nodules including an approximately 2.9 cm nodule on the left. Upper chest: Mild centrilobular  emphysema. Review of the MIP images confirms the above findings CTA HEAD FINDINGS Anterior circulation: The internal carotid arteries are patent from skull base to carotid termini with mild atherosclerotic plaque bilaterally and a mild stenosis of the proximal left supraclinoid ICA. ACAs and MCAs are patent without evidence of a proximal branch occlusion or significant proximal stenosis. No aneurysm is identified. Posterior circulation: The intracranial vertebral arteries are widely patent to the basilar. Patent PICA, AICA, and SCA origins are seen bilaterally. The basilar artery is widely patent. There is a diminutive left posterior communicating artery. Both PCAs are patent. Stenoses near the P1-P2 junctions are severe on the right and mild on the left. No aneurysm is identified. Venous sinuses: Patent. Anatomic variants: None. Review of the MIP images confirms the  above findings IMPRESSION: 1. No large vessel occlusion. 2. Cervical carotid atherosclerosis including ulcerated plaque in the left carotid bulb. No significant stenosis or dissection. 3. 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 4.  Emphysema (ICD10-J43.9). These results were called by telephone at the time of interpretation on 06/07/2021 at 10:09 am to Dr. Antoine Primas, who verbally acknowledged these results. Electronically Signed   By: Sebastian Ache M.D.   On: 06/07/2021 10:18     Assessment and Recommendation  70 y.o. male with a past medical history of hypertension, hyperlipidemia, paroxysmal nonvalvular atrial fibrillation who presented with rapid ventricular response with significant stroke symptoms.  MRI showed no evidence of hemorrhagic effect and echocardiogram showed no evidence of left ventricular systolic dysfunction or valvular heart disease and no evidence of interatrial shunt 1.  Continuation of anticoagulation for further risk reduction stroke with atrial fibrillation including Eliquis 5 mg twice per day 2.  Okay for  discontinuation of antiplatelet medication management when Eliquis for stroke risk with atrial fibrillation 3.  No further cardiac diagnostics necessary at this time 4.  Current dosage of metoprolol appears to be working fairly well for heart rate control and no change today 5.  Continue amlodipine for hypertension control and would consider addition of angiotensin receptor blocker for goal blood pressure slowly after stroke at 140 mm 6.  Begin ambulation and rehabilitation and would further consider okay for discharge to home with follow-up next week for further adjustments of medication management and better hypertension control  Signed, Maralyn Sago PA-C

## 2021-06-10 NOTE — Progress Notes (Signed)
[facility] PROGRESS NOTE    Darrell Collier  TTS:[id number] DOB: August 15, 1951 DOA: 06/07/2021 PCP: Danella Penton, MD      Brief Narrative:  Darrell Collier is a 70 y.o. M with HTN, A. Fib on Eliquis who presented with aphasia.  Patient lives alone, evidently was in her normal state of health until several days prior to admission when father had lunch with him.  Then on the day of admission the patient showed up to his PCPs office talking unintelligibly and was sent to the ER.  In the ER, he was in A. fib with rapid rate, started on diltiazem drip.  Blood pressure elevated.  CT of the head showed a subtle low-density in the posterior left temporal lobe.  Neurology and cardiology were consulted and the patient was admitted for stroke.    See interim summary from 8/6      Assessment & Plan:  Stroke CT head this normal shows no hemorrhage - Continue atorvastatin - Resume Eliquis  - Stop aspirin  Atrial fibrillation with RVR Persistent atrial fibrillation Rates controlled - Continue metoprolol    Hypertension Blood pressure controlled - Continue amlodipine - Continue metoprolol   CKD IIIb Cr at baseline 1.5-2.0          Disposition: Status is: Inpatient  Remains inpatient appropriate because:Unsafe d/c plan  Dispo: The patient is from: Home              Anticipated d/c is to: Home              Patient currently is not medically stable to d/c.   Difficult to place patient No   Patient was admitted with embolic stroke.   Initially family thought that the patient should go home, but after further discussion they did not feel they can adequately care for him at home.  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  Medication management as above.    DVT prophylaxis:  apixaban (ELIQUIS) tablet 5 mg  Code Status: FULL Family Communication: Father at the bedside, daughter at the bedside    Consultants:  Neurology Cardiology   Procedures:  8/4 echo -- normal EF, normal valves 8/4 CTA head and neck -- only PCA bilateral disase, no LVO 8/4 MRI brain -- left posterior MCA stroke 8/7 CT head-no hemorrhage         Subjective: Speech more fluent today, and receptive language seems to be improving, but still frequent aphasic expressions, has a lot of difficulty understanding certain words or expressions, including having difficulty operating his phone which she cannot consistently do.  No new focal weakness, loss of consciousness, fever, headache        Objective: Vitals:   06/10/21 0453 06/10/21 0733 06/10/21 1213 06/10/21 1620  BP: 130/84 (!) 140/98 (!) 114/91 (!) 126/95  Pulse: 63 67 66 79  Resp: 15 16 16 17   Temp: 97.6 F (36.4 C) 98 F (36.7 C) 98.2 F (36.8 C) 98.8 F (37.1 C)  TempSrc:  Oral  Oral  SpO2: 100% 100% 98% 97%  Weight:      Height:        Intake/Output Summary (Last 24 hours) at 06/10/2021 1636  Last data filed at 06/10/2021 1300 Gross per 24 hour  Intake 240 ml  Output --  Net 240 ml    Filed Weights   06/07/21 0841 06/07/21 2239  Weight: 86.2 kg 88.6 kg    Examination: General appearance: Adult male, sitting in recliner, no acute distress  HEENT:    Extraocular movements intact, has glasses, face symmetric. Skin:   Cardiac: Irregularly irregular, rate normal, no lower extremity edema Respiratory: Normal respiratory rate and rhythm, lungs clear without rales or wheezes Abdomen: Abdomen soft without tenderness palpation or guarding, no ascites or distention MSK:  Neuro: Awake and alert, extraocular movements intact, moves all extremities and normal strength and coordination, perhaps some mild weakness on the  left, but gait appears fairly good, speech with frequent aphasias, at other times fluid.Marland Kitchen    Psych: Attention normal, affect normal, judgment insight appear normal   Data Reviewed: I have personally reviewed following labs and imaging studies:  CBC: Recent Labs  Lab 06/07/21 0845  WBC 8.5  NEUTROABS 5.5  HGB 14.4  HCT 42.4  MCV 90.4  PLT 178   Basic Metabolic Panel: Recent Labs  Lab 06/07/21 0845  NA 140  K 3.6  CL 104  CO2 23  GLUCOSE 83  BUN 26*  CREATININE 1.46*  CALCIUM 9.0  MG 1.7   GFR: Estimated Creatinine Clearance: 54.7 mL/min (A) (by C-G formula based on SCr of 1.46 mg/dL (H)). Liver Function Tests: Recent Labs  Lab 06/07/21 0845  AST 21  ALT 12  ALKPHOS 89  BILITOT 1.7*  PROT 7.7  ALBUMIN 3.8   No results for input(s): LIPASE, AMYLASE in the last 168 hours. Recent Labs  Lab 06/08/21 0415  AMMONIA 21   Coagulation Profile: Recent Labs  Lab 06/07/21 0845  INR 1.1   Cardiac Enzymes: No results for input(s): CKTOTAL, CKMB, CKMBINDEX, TROPONINI in the last 168 hours. BNP (last 3 results) No results for input(s): PROBNP in the last 8760 hours. HbA1C: Recent Labs    06/08/21 0415  HGBA1C 5.5   CBG: Recent Labs  Lab 06/07/21 0834  GLUCAP 74   Lipid Profile: Recent Labs    06/08/21 0415  CHOL 152  HDL 35*  LDLCALC 106*  TRIG 56  CHOLHDL 4.3   Thyroid Function Tests: No results for input(s): TSH, T4TOTAL, FREET4, T3FREE, THYROIDAB in the last 72 hours. Anemia Panel: No results for input(s): VITAMINB12, FOLATE, FERRITIN, TIBC, IRON, RETICCTPCT in the last 72 hours. Urine analysis:    Component Value Date/Time   COLORURINE YELLOW (A) 06/07/2021 1024   APPEARANCEUR CLEAR (A) 06/07/2021 1024   LABSPEC 1.044 (H) 06/07/2021 1024   PHURINE 5.0 06/07/2021 1024   GLUCOSEU NEGATIVE 06/07/2021 1024   HGBUR SMALL (A) 06/07/2021 1024   BILIRUBINUR NEGATIVE 06/07/2021 1024   KETONESUR 20 (A) 06/07/2021 1024   PROTEINUR NEGATIVE  06/07/2021 1024   NITRITE NEGATIVE 06/07/2021 1024   LEUKOCYTESUR NEGATIVE 06/07/2021 1024   Sepsis Labs: @LABRCNTIP (procalcitonin:4,lacticacidven:4)  ) Recent Results (from the past 240 hour(s))  Urine Culture     Status: None   Collection Time: 06/07/21 10:24 AM   Specimen: Urine, Catheterized  Result Value Ref Range Status   Specimen Description   Final    URINE, CATHETERIZED Performed at [facility], [address] [place], [place]    Special Requests   Final    Normal Performed at [facility], [address], [place], [place]    Culture   Final  NO GROWTH Performed at [facility], [address] [place], [place] [address]    Report Status 06/08/2021 FINAL  Final  Resp Panel by RT-PCR (Flu A&B, Covid) Nasopharyngeal Swab     Status: None   Collection Time: 06/07/21  1:01 PM   Specimen: Nasopharyngeal Swab; Nasopharyngeal(NP) swabs in vial transport medium  Result Value Ref Range Status   SARS Coronavirus 2 by RT PCR NEGATIVE NEGATIVE Final    Comment: (NOTE) SARS-CoV-2 target nucleic acids are NOT DETECTED.  The SARS-CoV-2 RNA is generally detectable in upper respiratory specimens during the acute phase of infection. The lowest concentration of SARS-CoV-2 viral copies this assay can detect is 138 copies/mL. A negative result does not preclude SARS-Cov-2 infection and should not be used as the sole basis for treatment or other patient management decisions. A negative result may occur with  improper specimen collection/handling, submission of specimen other than nasopharyngeal swab, presence of viral mutation(s) within the areas targeted by this assay, and inadequate number of viral copies(<138 copies/mL). A negative result must be combined with clinical observations, patient history, and epidemiological information. The expected result is Negative.  Fact Sheet for Patients:   BloggerCourse.comhttps://www.fda.gov/media/152166/download  Fact Sheet for Healthcare Providers:  SeriousBroker.ithttps://www.fda.gov/media/152162/download  This test is no t yet approved or cleared by the [place] FDA and  has been authorized for detection and/or diagnosis of SARS-CoV-2 by FDA under an Emergency Use Authorization (EUA). This EUA will remain  in effect (meaning this test can be used) for the duration of the COVID-19 declaration under Section 564(b)(1) of the Act, 21 U.S.C.section 360bbb-3(b)(1), unless the authorization is terminated  or revoked sooner.       Influenza A by PCR NEGATIVE NEGATIVE Final   Influenza B by PCR NEGATIVE NEGATIVE Final    Comment: (NOTE) The Xpert Xpress SARS-CoV-2/FLU/RSV plus assay is intended as an aid in the diagnosis of influenza from Nasopharyngeal swab specimens and should not be used as a sole basis for treatment. Nasal washings and aspirates are unacceptable for Xpert Xpress SARS-CoV-2/FLU/RSV testing.  Fact Sheet for Patients: BloggerCourse.comhttps://www.fda.gov/media/152166/download  Fact Sheet for Healthcare Providers: SeriousBroker.ithttps://www.fda.gov/media/152162/download  This test is not yet approved or cleared by the [place] FDA and has been authorized for detection and/or diagnosis of SARS-CoV-2 by FDA under an Emergency Use Authorization (EUA). This EUA will remain in effect (meaning this test can be used) for the duration of the COVID-19 declaration under Section 564(b)(1) of the Act, 21 U.S.C. section 360bbb-3(b)(1), unless the authorization is terminated or revoked.  Performed at [facility], [address] [place], [place] [address]          Radiology Studies: CT HEAD WO CONTRAST (5MM)  Result Date: 06/10/2021 CLINICAL DATA:  Cerebral hemorrhage suspected EXAM: CT HEAD WITHOUT CONTRAST TECHNIQUE: Contiguous axial images were obtained from the base of the skull through the vertex without intravenous contrast. COMPARISON:  Brain MRI 3 days  ago FINDINGS: Brain: Progressively distinct left parietal infarct, MCA branch territory. No progression when compared to prior MRI. No acute hemorrhage. Chronic lacunar infarct at the right putamen, right caudate, and right thalamus. No hydrocephalus or collection. Vascular: No hyperdense vessel or unexpected calcification. Skull: Normal. Negative for fracture or focal lesion. Sinuses/Orbits: No acute finding. IMPRESSION: 1. Acute left MCA branch infarct.  No hemorrhage or progression. 2. Chronic lacunar infarcts. Electronically Signed   By: Marnee SpringJonathon  Watts M.D.   On: 06/10/2021 08:47        Scheduled Meds:  (  feeding supplement) PROSource Plus  30 mL Oral BID BM   amLODipine  5 mg Oral Daily   apixaban  5 mg Oral BID   aspirin  324 mg Oral Once   atorvastatin  80 mg Oral Daily   metoprolol tartrate  50 mg Oral BID   multivitamin with minerals  1 tablet Oral Daily   Continuous Infusions:   LOS: 3 days    Time spent: 35 minutes    Alberteen Sam, MD [facility] 06/10/2021, 4:36 PM     Please page though AMION or Epic secure chat:  For [employer], Higher education careers adviser

## 2021-06-11 NOTE — Care Management Important Message (Signed)
Important Message  Patient Details  Name: Darrell Collier MRN: [id number] Date of Birth: 1950-11-22   Medicare Important Message Given:  N/A - LOS <3 / Initial given by admissions     [name] 06/11/2021, 11:59 AM
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
## 2021-06-11 NOTE — TOC Progression Note (Signed)
Transition of Care [facility]) - Progression Note    Patient Details  Name: Darrell Collier MRN: [id number] Date of Birth: February 06, 1951  [facility] of [facility]) CM/SW Contact  Allayne Butcher, RN Phone Number: 06/11/2021, 3:20 PM  Clinical Narrative:    Patient is no longer a candidate for CIR but he does not have the ability to go home at this time due to no 24hr supervision.  SNF workup started- one bed offer from [place] talked with patient's son about patient going home with home health but he would need to figure out the supervision aspect because patient is not safe at home otherwise.    Expected Discharge Plan: IP Rehab Facility Barriers to Discharge: Continued Medical Work up  Expected Discharge Plan and Services Expected Discharge Plan: IP Rehab Facility   Discharge Planning Services: CM Consult Post Acute Care Choice: IP Rehab Living arrangements for the past 2 months: Single Family Home Expected Discharge Date: 06/10/21               DME Arranged: N/A DME Agency: NA                   Social Determinants of Health (SDOH) Interventions    Readmission Risk Interventions No flowsheet data found.

## 2021-06-11 NOTE — TOC Progression Note (Signed)
Transition of Care [facility]) - Progression Note    Patient Details  Name: Darrell Collier MRN: [id number] Date of Birth: 1951-02-24  Transition of Care [facility]) CM/SW Contact  Allayne Butcher, RN Phone Number: 06/11/2021, 9:40 AM  Clinical Narrative:    Stephanie Acre confirms that the family would like CIR.  He reports that he is working with his sister, uncle, and grandfather to come up with a plan for him discharging home- he has grandchildren also so great family support and plenty of people to help when he goes home.  Family definitely wants him back home once finished at [facility].     Expected Discharge Plan: IP Rehab Facility Barriers to Discharge: Continued Medical Work up  Expected Discharge Plan and Services Expected Discharge Plan: IP Rehab Facility   Discharge Planning Services: CM Consult Post Acute Care Choice: IP Rehab Living arrangements for the past 2 months: Single Family Home Expected Discharge Date: 06/10/21               DME Arranged: N/A DME Agency: NA                   Social Determinants of Health (SDOH) Interventions    Readmission Risk Interventions No flowsheet data found.

## 2021-06-11 NOTE — Progress Notes (Addendum)
Physical Therapy Treatment Patient Details Name: Darrell Collier MRN: [id number] DOB: 05/13/1951 Today's Date: 06/11/2021    History of Present Illness Pt is a 70 y.o. M presenting to ED during episode of a-fib RVR w/ MRI showing Acute infarct within the posterior left MCA territory.    PT Comments    Pt alert, willing to work with therapy. Pt notes frustration with inability to connect thoughts with appropriate verbage during treatment. Pt provided pen and paper to articulate thoughts while demonstrating intermittent inability to correctly write answers. Pt able to write hospital when instructed, as well as his own name, unable to write his birthday, current month, etc.  Pt requires supervision for transfers for safety without AD, and ambulates with min-guard during higher level tasks such as walking with head turns. Pt is challenged with dual tasking during motor tasks such as looking around the room while walking. Progressed treatment session to higher level balance activities such as single leg stance. Pt was able to hold 10 s/each leg following multiple attempts with ankle strategy for any LOB. Skilled PT intervention is indicated to address deficits in function, mobility, and to return to PLOF as able.  Discharge recommendations are being updated to home with PT due to the patient's ability to ambulate and transfer safely and ability to perform higher level mobility tasks, 24/7 supervision recommended due to safety concerns with pt cognitive status and aphasia. If unable to obtain 24/7 supervision, SNF could be considered for safety concerns.      Follow Up Recommendations  Home health PT;Outpatient PT;Supervision/Assistance - 24 hour (outpatient PT if pt has resources for driving)     Equipment Recommendations  None recommended by PT    Recommendations for Other Services       Precautions / Restrictions Precautions Precautions: Fall Restrictions Weight Bearing Restrictions: No     Mobility  Bed Mobility Overal bed mobility: Modified Independent             General bed mobility comments: Mod-I for bed rail    Transfers Overall transfer level: Needs assistance Equipment used: None Transfers: Sit to/from Stand Sit to Stand: Supervision Stand pivot transfers: Supervision       General transfer comment: supervision for safety and environmental awareness  Ambulation/Gait Ambulation/Gait assistance: Min guard Gait Distance (Feet): 50 Feet Assistive device: None       General Gait Details: Pt requires min-gaurd for higher level tasks with ambulation such as looking around the room while walking.   Stairs             Wheelchair Mobility    Modified Rankin (Stroke Patients Only)       Balance Overall balance assessment: Needs assistance Sitting-balance support: Feet supported;No upper extremity supported Sitting balance-Leahy Scale: Normal     Standing balance support: During functional activity;No upper extremity supported Standing balance-Leahy Scale: Good   Single Leg Stance - Right Leg: 10 (seconds) Single Leg Stance - Left Leg: 10 (seconds) Tandem Stance - Right Leg: 4 (second)         High Level Balance Comments: Pt was able to perform single leg stance without UE support with min-gaurd. LOB corrected with ankle strategy.            Cognition Arousal/Alertness: Awake/alert Behavior During Therapy: WFL for tasks assessed/performed Overall Cognitive Status: Difficult to assess  General Comments: Cognitive status difficult to assess secondary to aphasia. Pt is able to follow 1-step commands but demonstrates difficulty with dual tasking.      Exercises Other Exercises Other Exercises: Single leg stance/each leg: x 10 with attempts to hold for 10 seconds with min-gaurd for safety. Other Exercises: Ambulated in room with min-gaurd with cues for horizontal and vertical  cervical rotation.    General Comments        Pertinent Vitals/Pain Pain Assessment: No/denies pain Faces Pain Scale: No hurt    Home Living                      Prior Function            PT Goals (current goals can now be found in the care plan section) Acute Rehab PT Goals Patient Stated Goal: to go home Time For Goal Achievement: 06/22/21 Potential to Achieve Goals: Good Progress towards PT goals: Progressing toward goals    Frequency    Min 2X/week      PT Plan Discharge plan needs to be updated    Co-evaluation              AM-PAC PT "6 Clicks" Mobility   Outcome Measure  Help needed turning from your back to your side while in a flat bed without using bedrails?: None Help needed moving from lying on your back to sitting on the side of a flat bed without using bedrails?: None Help needed moving to and from a bed to a chair (including a wheelchair)?: None Help needed standing up from a chair using your arms (e.g., wheelchair or bedside chair)?: None Help needed to walk in hospital room?: A Little Help needed climbing 3-5 steps with a railing? : A Little 6 Click Score: 22    End of Session Equipment Utilized During Treatment: Gait belt Activity Tolerance: Patient tolerated treatment well Patient left: in chair;with call bell/phone within reach Nurse Communication: Mobility status PT Visit Diagnosis: Unsteadiness on feet (R26.81);Other abnormalities of gait and mobility (R26.89);Other symptoms and signs involving the nervous system (B90.383)     Time: 3383-2919 PT Time Calculation (min) (ACUTE ONLY): 27 min  Charges:                       [employer], SPT

## 2021-06-11 NOTE — Progress Notes (Signed)
[facility] PROGRESS NOTE    Darrell Collier  ZOX:[id number] DOB: [birth year] DOA: 06/07/2021 PCP: Danella Penton, MD      Brief Narrative:  [name] is a 70 y.o. M with HTN, A. Fib on Eliquis who presented with aphasia.  Patient lives alone, evidently was in her normal state of health until several days prior to admission when father had lunch with him.  Then on the day of admission the patient showed up to his PCPs office talking unintelligibly and was sent to the ER.  In the ER, he was in A. fib with rapid rate, started on diltiazem drip.  Blood pressure elevated.  CT of the head showed a subtle low-density in the posterior left temporal lobe.  Neurology and cardiology were consulted and the patient was admitted for stroke.    See interim summary from 8/6      Assessment & Plan:  Stroke Patient still exhibits aphasia which is limiting his functional status - Continue atorvastatin, Eliquis  Atrial fibrillation with RVR Persistent atrial fibrillation Rate controlled - Continue metoprolol    Hypertension Blood pressure improving - New amlodipine which is new - Continue metoprolol  CKD IIIb Cr at baseline 1.5-2.0          Disposition: Status is: Inpatient  Remains inpatient appropriate because:Unsafe d/c plan  Dispo: The patient is from: Home              Anticipated d/c is to: Home              Patient currently is not medically stable to d/c.   Difficult to place patient No   Patient was admitted with embolic stroke.   Initially family thought that the patient should go home, but after further discussion they did not feel they can adequately care for him at home.    Given his apraxia, receptive and expressive aphasia, and functional deficits We certainly feel the patient would benefit considerably from intensive speech therapy and rehabilitation setting prior to returning home   Level of care: Med-Surg       MDM: The below  labs and imaging reports were reviewed and summarized above.  Medication management as above.    DVT prophylaxis:  apixaban (ELIQUIS) tablet 5 mg  Code Status: FULL Family Communication:       Consultants:  Neurology Cardiology   Procedures:  8/4 echo -- normal EF, normal valves 8/4 CTA head and neck -- only PCA bilateral disase, no LVO 8/4 MRI brain -- left posterior MCA stroke 8/7 CT head-no hemorrhage         Subjective: Still having some difficulty with his phone, identifying The Phone, Other Tools.  Still Has Some Expressive Aphasia.  This Makes about Half of What He Says Is Unintelligible.  No Focal Weakness, Fever, Confusion.      Objective: Vitals:   06/10/21 1620 06/10/21 2116 06/10/21 2357 06/11/21 0620  BP: (!) 126/95 (!) 126/92 140/81 (!) 146/94  Pulse: 79 (!) 58 75 (!) 59  Resp: 17 18 16 16   Temp: 98.8 F (37.1 C) 97.6 F (36.4 C) 98 F (36.7 C) 98 F (36.7 C)  TempSrc: Oral Oral Oral Oral  SpO2: 97% 98% 99% 100%  Weight:      Height:        Intake/Output Summary (Last 24 hours) at 06/11/2021 0750 Last data filed at 06/11/2021 0000 Gross per 24 hour  Intake 477 ml  Output --  Net 477  ml    Filed Weights   06/07/21 0841 06/07/21 2239  Weight: 86.2 kg 88.6 kg    Examination: General appearance: Adult male, sitting up in bed, trying to use his phone. HEENT:    Face symmetric, extraocular movements intact Skin:   Cardiac: Heart rate controlled, no lower extremity edema, no murmurs Respiratory: Normal respiratory rate and rhythm, lungs clear without rales or wheezes Abdomen: Abdomen soft without tenderness palpation or guarding MSK:  Neuro: Awake and alert, strength in the upper extremities is 5/5 and symmetric, mild left lower extremity strength deficit, maybe 5 -/5 relative to normal on the right.  Speech is fluent, but about half of what he says is unintelligible malapropisms Psych: Attention normal, affect normal, judgment insight  appear normal   Data Reviewed: I have personally reviewed following labs and imaging studies:  CBC: Recent Labs  Lab 06/07/21 0845  WBC 8.5  NEUTROABS 5.5  HGB 14.4  HCT 42.4  MCV 90.4  PLT 178   Basic Metabolic Panel: Recent Labs  Lab 06/07/21 0845  NA 140  K 3.6  CL 104  CO2 23  GLUCOSE 83  BUN 26*  CREATININE 1.46*  CALCIUM 9.0  MG 1.7   GFR: Estimated Creatinine Clearance: 54.7 mL/min (A) (by C-G formula based on SCr of 1.46 mg/dL (H)). Liver Function Tests: Recent Labs  Lab 06/07/21 0845  AST 21  ALT 12  ALKPHOS 89  BILITOT 1.7*  PROT 7.7  ALBUMIN 3.8   No results for input(s): LIPASE, AMYLASE in the last 168 hours. Recent Labs  Lab 06/08/21 0415  AMMONIA 21   Coagulation Profile: Recent Labs  Lab 06/07/21 0845  INR 1.1   Cardiac Enzymes: No results for input(s): CKTOTAL, CKMB, CKMBINDEX, TROPONINI in the last 168 hours. BNP (last 3 results) No results for input(s): PROBNP in the last 8760 hours. HbA1C: No results for input(s): HGBA1C in the last 72 hours.  CBG: Recent Labs  Lab 06/07/21 0834  GLUCAP 74   Lipid Profile: No results for input(s): CHOL, HDL, LDLCALC, TRIG, CHOLHDL, LDLDIRECT in the last 72 hours.  Thyroid Function Tests: No results for input(s): TSH, T4TOTAL, FREET4, T3FREE, THYROIDAB in the last 72 hours. Anemia Panel: No results for input(s): VITAMINB12, FOLATE, FERRITIN, TIBC, IRON, RETICCTPCT in the last 72 hours. Urine analysis:    Component Value Date/Time   COLORURINE YELLOW (A) 06/07/2021 1024   APPEARANCEUR CLEAR (A) 06/07/2021 1024   LABSPEC 1.044 (H) 06/07/2021 1024   PHURINE 5.0 06/07/2021 1024   GLUCOSEU NEGATIVE 06/07/2021 1024   HGBUR SMALL (A) 06/07/2021 1024   BILIRUBINUR NEGATIVE 06/07/2021 1024   KETONESUR 20 (A) 06/07/2021 1024   PROTEINUR NEGATIVE 06/07/2021 1024   NITRITE NEGATIVE 06/07/2021 1024   LEUKOCYTESUR NEGATIVE 06/07/2021 1024   Sepsis  Labs: @LABRCNTIP (procalcitonin:4,lacticacidven:4)  ) Recent Results (from the past 240 hour(s))  Urine Culture     Status: None   Collection Time: 06/07/21 10:24 AM   Specimen: Urine, Catheterized  Result Value Ref Range Status   Specimen Description   Final    URINE, CATHETERIZED Performed at [facility], [address] [place], [place]    Special Requests   Final    Normal Performed at [facility], [address] [place], [place]    Culture   Final    NO GROWTH Performed at [facility], [address], [place], [place]    Report Status 06/08/2021 FINAL  Final  Resp Panel by RT-PCR (Flu A&B, Covid) Nasopharyngeal Swab     Status: None   Collection Time: 06/07/21  1:01 PM   Specimen: Nasopharyngeal Swab; Nasopharyngeal(NP) swabs in vial transport medium  Result Value Ref Range Status   SARS Coronavirus 2 by RT PCR NEGATIVE NEGATIVE Final    Comment: (NOTE) SARS-CoV-2 target nucleic acids are NOT DETECTED.  The SARS-CoV-2 RNA is generally detectable in upper respiratory specimens during the acute phase of infection. The lowest concentration of SARS-CoV-2 viral copies this assay can detect is 138 copies/mL. A negative result does not preclude SARS-Cov-2 infection and should not be used as the sole basis for treatment or other patient management decisions. A negative result may occur with  improper specimen collection/handling, submission of specimen other than nasopharyngeal swab, presence of viral mutation(s) within the areas targeted by this assay, and inadequate number of viral copies(<138 copies/mL). A negative result must be combined with clinical observations, patient history, and epidemiological information. The expected result is Negative.  Fact Sheet for Patients:  BloggerCourse.com  Fact Sheet for Healthcare Providers:  SeriousBroker.it  This test is  no t yet approved or cleared by the [place] FDA and  has been authorized for detection and/or diagnosis of SARS-CoV-2 by FDA under an Emergency Use Authorization (EUA). This EUA will remain  in effect (meaning this test can be used) for the duration of the COVID-19 declaration under Section 564(b)(1) of the Act, 21 U.S.C.section 360bbb-3(b)(1), unless the authorization is terminated  or revoked sooner.       Influenza A by PCR NEGATIVE NEGATIVE Final   Influenza B by PCR NEGATIVE NEGATIVE Final    Comment: (NOTE) The Xpert Xpress SARS-CoV-2/FLU/RSV plus assay is intended as an aid in the diagnosis of influenza from Nasopharyngeal swab specimens and should not be used as a sole basis for treatment. Nasal washings and aspirates are unacceptable for Xpert Xpress SARS-CoV-2/FLU/RSV testing.  Fact Sheet for Patients: BloggerCourse.com  Fact Sheet for Healthcare Providers: SeriousBroker.it  This test is not yet approved or cleared by the [place] FDA and has been authorized for detection and/or diagnosis of SARS-CoV-2 by FDA under an Emergency Use Authorization (EUA). This EUA will remain in effect (meaning this test can be used) for the duration of the COVID-19 declaration under Section 564(b)(1) of the Act, 21 U.S.C. section 360bbb-3(b)(1), unless the authorization is terminated or revoked.  Performed at [facility], [address], [place], [place] [address]          Radiology Studies: CT HEAD WO CONTRAST ( )  Result Date: 06/10/2021 CLINICAL DATA:  Cerebral hemorrhage suspected EXAM: CT HEAD WITHOUT CONTRAST TECHNIQUE: Contiguous axial images were obtained from the base of the skull through the vertex without intravenous contrast. COMPARISON:  Brain MRI 3 days ago FINDINGS: Brain: Progressively distinct left parietal infarct, MCA branch territory. No progression when compared to prior MRI. No acute  hemorrhage. Chronic lacunar infarct at the right putamen, right caudate, and right thalamus. No hydrocephalus or collection. Vascular: No hyperdense vessel or unexpected calcification. Skull: Normal. Negative for fracture or focal lesion. Sinuses/Orbits: No acute finding. IMPRESSION: 1. Acute left MCA branch infarct.  No hemorrhage or progression. 2. Chronic lacunar infarcts. Electronically Signed   By: Marnee Spring M.D.   On: 06/10/2021 08:47        Scheduled Meds:  (feeding supplement) PROSource Plus  30 mL Oral BID BM   amLODipine  5 mg Oral Daily   apixaban  5 mg Oral  BID   aspirin  324 mg Oral Once   atorvastatin  80 mg Oral Daily   metoprolol tartrate  50 mg Oral BID   multivitamin with minerals  1 tablet Oral Daily   Continuous Infusions:   LOS: 4 days    Time spent: 25 minutes    Alberteen Samhristopher P Deryck Hippler, MD [facility] 06/11/2021, 7:50 AM     Please page though AMION or Epic secure chat:  For [employer] Holdings Corporationmion password, Higher education careers advisercontact charge nurse

## 2021-06-11 NOTE — Progress Notes (Signed)
Inpatient Rehab Admissions Coordinator:   I await insurance auth for CIR. Pt.'s family states interest and are working on getting 24/7 support together. I will continue to follow for potential admission, but note that Pt. Is already at min A to min G level. If he progresses to supervision level or if family is unable to provide 24/7 support, We will not be able to accept him to CIR.   Megan Salon, MS, CCC-SLP Rehab Admissions Coordinator  [phone] (celll) [phone] (office)

## 2021-06-12 NOTE — Progress Notes (Signed)
Occupational Therapy Treatment Patient Details Name: Darrell Collier MRN: [id number] DOB: 10/21/1951 Today's Date: 06/12/2021    History of present illness Pt is a 70 y.o. M presenting to ED during episode of a-fib RVR w/ MRI showing Acute infarct within the posterior left MCA territory.   OT comments  Upon entering the room, pt supine in bed with father present in the room. Pt's father discussing recommendation for 24/7 supervision and asking him to consider staying with family. Pt is agreeable to OT intervention. He was given instructions for pillbox test. Pt is unable to correctly read aloud the directions for taking medication. He was able to correctly interpret and place "pills" in correct location for pill box. He did require 12 minutes to complete task vs the 5 minutes allotted. Pt making progress towards goals.    Follow Up Recommendations  Home health OT;Outpatient OT;Supervision/Assistance - 24 hour    Equipment Recommendations  None recommended by OT       Precautions / Restrictions Precautions Precautions: Fall Restrictions Weight Bearing Restrictions: No       Mobility Bed Mobility Overal bed mobility: Independent             General bed mobility comments: Pt seated at EOB beginning and end of PT session    Transfers Overall transfer level: Needs assistance Equipment used: None Transfers: Sit to/from Stand Sit to Stand: Supervision         General transfer comment: supervision for safety and environmental awareness    Balance Overall balance assessment: Independent Sitting-balance support: Feet supported;No upper extremity supported Sitting balance-Leahy Scale: Normal Sitting balance - Comments: Able to reach across body to contralateral LE   Standing balance support: During functional activity;No upper extremity supported Standing balance-Leahy Scale: Good Standing balance comment: Able to stand without extremity support                            ADL either performed or assessed with clinical judgement     Vision Patient Visual Report: No change from baseline            Cognition Arousal/Alertness: Awake/alert Behavior During Therapy: WFL for tasks assessed/performed Overall Cognitive Status: Difficult to assess                                 General Comments: given pill box test and pt was able to accurate read labels and place "pills" with increased time        Exercises Other Exercises Other Exercises: Seated reciprocal UE reach to contralateral LE w/ hip flexion x10/each side. Visual cues with repetition for pt comprehension. Other Exercises: Ambulated in room around pre-set obstacles x 2 min. Pt's environmental awareness requires verbal and tactile cues when manuevering around obstacles and repetition to minimize ongoing mistakes such as lightly bumping a shoulder into the computer station.           Pertinent Vitals/ Pain       Pain Assessment: No/denies pain Faces Pain Scale: No hurt   Frequency  Min 2X/week        Progress Toward Goals  OT Goals(current goals can now be found in the care plan section)  Progress towards OT goals: Progressing toward goals  Acute Rehab OT Goals Patient Stated Goal: to go home OT Goal Formulation: With patient Time For Goal Achievement: 06/22/21 Potential to Achieve Goals: Fair  Plan Discharge plan remains appropriate;Frequency needs to be updated       AM-PAC OT "6 Clicks" Daily Activity     Outcome Measure   Help from another person eating meals?: None Help from another person taking care of personal grooming?: None Help from another person toileting, which includes using toliet, bedpan, or urinal?: A Little Help from another person bathing (including washing, rinsing, drying)?: A Little Help from another person to put on and taking off regular upper body clothing?: None Help from another person to put on and taking off regular  lower body clothing?: A Little 6 Click Score: 21    End of Session    OT Visit Diagnosis: Unsteadiness on feet (R26.81);Muscle weakness (generalized) (M62.81);Other symptoms and signs involving the nervous system (R29.898);Other symptoms and signs involving cognitive function   Activity Tolerance Patient tolerated treatment well   Patient Left with call bell/phone within reach;in chair             Time: 5701-7793 OT Time Calculation (min): 26 min  Charges: OT General Charges $OT Visit: 1 Visit OT Treatments $Therapeutic Activity: 23-37 mins  Jackquline Denmark, MS, OTR/L , CBIS ascom [phone]  06/12/21, 4:25 PM

## 2021-06-12 NOTE — Progress Notes (Signed)
Inpatient Rehab Admissions Coordinator:   Pt. Appears to have progressed to the point that PT/OT are no longer recommending CIR. We will sign off. Pt.'s son was notified.   Megan Salon, MS, CCC-SLP Rehab Admissions Coordinator  [phone] (celll) [phone] (office)

## 2021-06-12 NOTE — TOC Progression Note (Signed)
Transition of Care [facility]) - Progression Note    Patient Details  Name: Darrell Collier MRN: [id number] Date of Birth: July 17, 1951  [facility] CM/SW Contact  Allayne Butcher, RN Phone Number: 06/12/2021, 3:03 PM  Clinical Narrative:    Patient is not safe to go home alone and he does not have any family that can provide 24 hour supervision at this time.  Patient's son is in [place] and not planning to get back until the end of the week.  RNCM unable to reach patient's daughter.  Patient's father is elderly and cannot stay with him and patient cannot stay at his home.  Patient has also reported he does not have water at this time (unsure why).   Patient was unable to get into his phone to dial out today and barely able to answer his phone with father helping him in the room.  [facility] offer [facility] bed and bed offer accepted.  Revonda Standard with [facility] will start insurance authorization for SNF.   Patient still has extensive aphagia.   Expected Discharge Plan: Skilled Nursing Facility Barriers to Discharge: Insurance Authorization  Expected Discharge Plan and Services Expected Discharge Plan: Skilled Nursing Facility   Discharge Planning Services: CM Consult Post Acute Care Choice: Skilled Nursing Facility Living arrangements for the past 2 months: Single Family Home Expected Discharge Date: 06/10/21               DME Arranged: N/A DME Agency: NA                   Social Determinants of Health (SDOH) Interventions    Readmission Risk Interventions No flowsheet data found.

## 2021-06-12 NOTE — Progress Notes (Addendum)
Physical Therapy Treatment Patient Details Name: Darrell Collier MRN: [id number] DOB: [birth year] Today's Date: 06/12/2021    History of Present Illness Pt is a 69 y.o. M presenting to ED during episode of a-fib RVR w/ MRI showing Acute infarct within the posterior left MCA territory.    PT Comments    Pt seated on EOB beginning of treatment with family present. Pt agreeable to treatment. Progressed treatment to assess stair navigation with supervision and hand rail. Pt demonstrated good overall stability when both ascending and descending the steps with a step-through pattern. Continued to work on higher level balance and gait activities required for ADL such as walking with head turns. LOB was noted once during walking in which ankle strategy was utilized during recovery. When performing dual tasks pt continues to be challenged with environmental awareness intermittently either reaching for a wall or being unaware of objects in the surrounding until the objects are immediately present. Skilled PT intervention is indicated to address deficits in function, mobility, and to return to PLOF as able.  Discharge recommendations include HHPT or outpatient PT with 24-hr supervision/assistance.     Follow Up Recommendations  Home health PT;Outpatient PT;Supervision/Assistance - 24 hour     Equipment Recommendations  None recommended by PT    Recommendations for Other Services       Precautions / Restrictions Precautions Precautions: Fall Restrictions Weight Bearing Restrictions: No    Mobility  Bed Mobility               General bed mobility comments: Pt seated at EOB beginning and end of PT session    Transfers Overall transfer level: Needs assistance Equipment used: None Transfers: Sit to/from Stand Sit to Stand: Supervision         General transfer comment: supervision for safety and environmental awareness  Ambulation/Gait Ambulation/Gait assistance: Supervision Gait  Distance (Feet): 200 Feet Assistive device: None Gait Pattern/deviations: Step-through pattern     General Gait Details: Pt had LOB x 1 requiring pt to reach for wall. Pt utilized ankle strategy for recovery. Ambulated with supervision during cervical horizontal head turns with no LOB.   Stairs Stairs: Yes Stairs assistance: Supervision Stair Management: One rail Right;Alternating pattern Number of Stairs: 8 General stair comments: No LOB noted with ascending or descending the stairs   Wheelchair Mobility    Modified Rankin (Stroke Patients Only)       Balance Overall balance assessment: Needs assistance Sitting-balance support: Feet supported;No upper extremity supported Sitting balance-Leahy Scale: Normal Sitting balance - Comments: Able to reach across body to contralateral LE   Standing balance support: During functional activity;No upper extremity supported Standing balance-Leahy Scale: Good Standing balance comment: Able to stand without extremity support                            Cognition Arousal/Alertness: Awake/alert Behavior During Therapy: WFL for tasks assessed/performed Overall Cognitive Status: Difficult to assess                                 General Comments: Cognitive status difficult to assess secondary to aphasia. Pt is able to follow 1-step commands but demonstrates difficulty with dual tasking.      Exercises Other Exercises Other Exercises: Seated reciprocal UE reach to contralateral LE w/ hip flexion x10/each side. Visual cues with repetition for pt comprehension. Other Exercises: Ambulated in room around  pre-set obstacles x 2 min. Pt's environmental awareness requires verbal and tactile cues when manuevering around obstacles and repetition to minimize ongoing mistakes such as lightly bumping a shoulder into the computer station.    General Comments        Pertinent Vitals/Pain Pain Assessment: No/denies pain     Home Living                      Prior Function            PT Goals (current goals can now be found in the care plan section) Acute Rehab PT Goals Patient Stated Goal: to go home Time For Goal Achievement: 06/22/21 Potential to Achieve Goals: Good Progress towards PT goals: Progressing toward goals    Frequency    Min 2X/week      PT Plan Current plan remains appropriate    Co-evaluation              AM-PAC PT "6 Clicks" Mobility   Outcome Measure  Help needed turning from your back to your side while in a flat bed without using bedrails?: None Help needed moving from lying on your back to sitting on the side of a flat bed without using bedrails?: None Help needed moving to and from a bed to a chair (including a wheelchair)?: None Help needed standing up from a chair using your arms (e.g., wheelchair or bedside chair)?: None Help needed to walk in hospital room?: A Little Help needed climbing 3-5 steps with a railing? : A Little 6 Click Score: 22    End of Session Equipment Utilized During Treatment: Gait belt Activity Tolerance: Patient tolerated treatment well Patient left: in bed;with call bell/phone within reach;with family/visitor present   PT Visit Diagnosis: Unsteadiness on feet (R26.81);Other abnormalities of gait and mobility (R26.89);Other symptoms and signs involving the nervous system (R29.898)     Time: 3151-7616 PT Time Calculation (min) (ACUTE ONLY): 14 min  Charges:                        [employer], SPT

## 2021-06-12 NOTE — Progress Notes (Signed)
[facility] PROGRESS NOTE    Darrell Collier  BWG:[id number] DOB: 1951/05/28 DOA: 06/07/2021 PCP: Danella Penton, MD      Brief Narrative:  Darrell Collier is a 70 y.o. M with HTN, A. Fib on Eliquis who presented with aphasia.  Patient lives alone, evidently was in her normal state of health until several days prior to admission when father had lunch with him.  Then on the day of admission the patient showed up to his PCPs office talking unintelligibly and was sent to the ER.  In the ER, he was in A. fib with rapid rate, started on diltiazem drip.  Blood pressure elevated.  CT of the head showed a subtle low-density in the posterior left temporal lobe.  Neurology and cardiology were consulted and the patient was admitted for stroke.    See interim summary from 8/6      Assessment & Plan:  See interim summary 8/6      Stroke No improvement in aphasia, functional status - Continue atorvastatin - Continue Eliquis  Atrial fibrillation with RVR Persistent atrial fibrillation Rate normal - Continue metoprolol    Hypertnesion BP improved - Cotninue new amlodipine -Continue metoprolol  CKD IIIb Cr at baseline 1.5-2.0          Disposition: Status is: Inpatient  Remains inpatient appropriate because:Unsafe d/c plan  Dispo: The patient is from: Home              Anticipated d/c is to: Home              Patient currently is not medically stable to d/c.   Difficult to place patient No   Patient was admitted with embolic stroke.  Given his apraxia, receptive and expressive aphasia, and functional deficits the patient is unable to function safely alone, and requires skilled nursing level of supervision for  intensive speech therapy and rehabilitation setting prior to returning home   We are applying for authorization for SNF now.       Level of care: Med-Surg       MDM: The below labs and imaging reports were reviewed and summarized  above.  Medication management as above.    DVT prophylaxis:  apixaban (ELIQUIS) tablet 5 mg  Code Status: FULL Family Communication:       Consultants:  Neurology Cardiology   Procedures:  8/4 echo -- normal EF, normal valves 8/4 CTA head and neck -- only PCA bilateral disase, no LVO 8/4 MRI brain -- left posterior MCA stroke 8/7 CT head-no hemorrhage         Subjective: No change to expressive aphasia, apraxia.  No new focal weakness, numbness, headache, loss of consciousness.      Objective: Vitals:   06/12/21 0426 06/12/21 0830 06/12/21 1146 06/12/21 1612  BP: (!) 141/90 (!) 153/96 129/89 131/84  Pulse: 66 74 65 65  Resp: 16 16 14 18   Temp: 97.7 F (36.5 C) 98.8 F (37.1 C) 98.1 F (36.7 C) 98.1 F (36.7 C)  TempSrc: Oral     SpO2: 100% 98% 100% 98%  Weight:      Height:        Intake/Output Summary (Last 24 hours) at 06/12/2021 1711 Last data filed at 06/12/2021 1300 Gross per 24 hour  Intake 600 ml  Output --  Net 600 ml    Filed Weights   06/07/21 0841 06/07/21 2239  Weight: 86.2 kg 88.6 kg    Examination: General appearance: Adult male,  sitting in bed, watching television     HEENT:    Skin:  Cardiac: Irregularly irregular heart rate, no murmurs, no lower extremity edema Respiratory: Normal respiratory rate and rhythm, lungs clear without rales or wheezes Abdomen:   MSK:  Neuro: Extraocular movements intact, face symmetric, moves upper extremities with normal strength and coordination, mild left leg weakness, about half of what he says intelligible, the others aphasic.  Patient has difficulty using his phone, Psych:     Data Reviewed: I have personally reviewed following labs and imaging studies:  CBC: Recent Labs  Lab 06/07/21 0845  WBC 8.5  NEUTROABS 5.5  HGB 14.4  HCT 42.4  MCV 90.4  PLT 178   Basic Metabolic Panel: Recent Labs  Lab 06/07/21 0845  NA 140  K 3.6  CL 104  CO2 23  GLUCOSE 83  BUN 26*  CREATININE  1.46*  CALCIUM 9.0  MG 1.7   GFR: Estimated Creatinine Clearance: 54.7 mL/min (A) (by C-G formula based on SCr of 1.46 mg/dL (H)). Liver Function Tests: Recent Labs  Lab 06/07/21 0845  AST 21  ALT 12  ALKPHOS 89  BILITOT 1.7*  PROT 7.7  ALBUMIN 3.8   No results for input(s): LIPASE, AMYLASE in the last 168 hours. Recent Labs  Lab 06/08/21 0415  AMMONIA 21   Coagulation Profile: Recent Labs  Lab 06/07/21 0845  INR 1.1   Cardiac Enzymes: No results for input(s): CKTOTAL, CKMB, CKMBINDEX, TROPONINI in the last 168 hours. BNP (last 3 results) No results for input(s): PROBNP in the last 8760 hours. HbA1C: No results for input(s): HGBA1C in the last 72 hours.  CBG: Recent Labs  Lab 06/07/21 0834  GLUCAP 74   Lipid Profile: No results for input(s): CHOL, HDL, LDLCALC, TRIG, CHOLHDL, LDLDIRECT in the last 72 hours.  Thyroid Function Tests: No results for input(s): TSH, T4TOTAL, FREET4, T3FREE, THYROIDAB in the last 72 hours. Anemia Panel: No results for input(s): VITAMINB12, FOLATE, FERRITIN, TIBC, IRON, RETICCTPCT in the last 72 hours. Urine analysis:    Component Value Date/Time   COLORURINE YELLOW (A) 06/07/2021 1024   APPEARANCEUR CLEAR (A) 06/07/2021 1024   LABSPEC 1.044 (H) 06/07/2021 1024   PHURINE 5.0 06/07/2021 1024   GLUCOSEU NEGATIVE 06/07/2021 1024   HGBUR SMALL (A) 06/07/2021 1024   BILIRUBINUR NEGATIVE 06/07/2021 1024   KETONESUR 20 (A) 06/07/2021 1024   PROTEINUR NEGATIVE 06/07/2021 1024   NITRITE NEGATIVE 06/07/2021 1024   LEUKOCYTESUR NEGATIVE 06/07/2021 1024   Sepsis Labs: @LABRCNTIP (procalcitonin:4,lacticacidven:4)  ) Recent Results (from the past 240 hour(s))  Urine Culture     Status: None   Collection Time: 06/07/21 10:24 AM   Specimen: Urine, Catheterized  Result Value Ref Range Status   Specimen Description   Final    URINE, CATHETERIZED Performed at [facility], [address] [place], [place]     Special Requests   Final    Normal Performed at [facility], [address] [place], [place]    Culture   Final    NO GROWTH Performed at [facility], [address] [place], [place]    Report Status 06/08/2021 FINAL  Final  Resp Panel by RT-PCR (Flu A&B, Covid) Nasopharyngeal Swab     Status: None   Collection Time: 06/07/21  1:01 PM   Specimen: Nasopharyngeal Swab; Nasopharyngeal(NP) swabs in vial transport medium  Result Value Ref Range Status   SARS Coronavirus 2 by RT PCR NEGATIVE  NEGATIVE Final    Comment: (NOTE) SARS-CoV-2 target nucleic acids are NOT DETECTED.  The SARS-CoV-2 RNA is generally detectable in upper respiratory specimens during the acute phase of infection. The lowest concentration of SARS-CoV-2 viral copies this assay can detect is 138 copies/mL. A negative result does not preclude SARS-Cov-2 infection and should not be used as the sole basis for treatment or other patient management decisions. A negative result may occur with  improper specimen collection/handling, submission of specimen other than nasopharyngeal swab, presence of viral mutation(s) within the areas targeted by this assay, and inadequate number of viral copies(<138 copies/mL). A negative result must be combined with clinical observations, patient history, and epidemiological information. The expected result is Negative.  Fact Sheet for Patients:  BloggerCourse.com  Fact Sheet for Healthcare Providers:  SeriousBroker.it  This test is no t yet approved or cleared by the [place] FDA and  has been authorized for detection and/or diagnosis of SARS-CoV-2 by FDA under an Emergency Use Authorization (EUA). This EUA will remain  in effect (meaning this test can be used) for the duration of the COVID-19 declaration under Section 564(b)(1) of the Act, 21 U.S.C.section 360bbb-3(b)(1), unless the  authorization is terminated  or revoked sooner.       Influenza A by PCR NEGATIVE NEGATIVE Final   Influenza B by PCR NEGATIVE NEGATIVE Final    Comment: (NOTE) The Xpert Xpress SARS-CoV-2/FLU/RSV plus assay is intended as an aid in the diagnosis of influenza from Nasopharyngeal swab specimens and should not be used as a sole basis for treatment. Nasal washings and aspirates are unacceptable for Xpert Xpress SARS-CoV-2/FLU/RSV testing.  Fact Sheet for Patients: BloggerCourse.com  Fact Sheet for Healthcare Providers: SeriousBroker.it  This test is not yet approved or cleared by the [place] FDA and has been authorized for detection and/or diagnosis of SARS-CoV-2 by FDA under an Emergency Use Authorization (EUA). This EUA will remain in effect (meaning this test can be used) for the duration of the COVID-19 declaration under Section 564(b)(1) of the Act, 21 U.S.C. section 360bbb-3(b)(1), unless the authorization is terminated or revoked.  Performed at [facility], [address] [place], [place] [address]          Radiology Studies: No results found.      Scheduled Meds:  (feeding supplement) PROSource Plus  30 mL Oral BID BM   amLODipine  5 mg Oral Daily   apixaban  5 mg Oral BID   atorvastatin  80 mg Oral Daily   metoprolol tartrate  50 mg Oral BID   multivitamin with minerals  1 tablet Oral Daily   Continuous Infusions:   LOS: 5 days    Time spent: 25 minutes    Alberteen Sam, MD [facility] 06/12/2021, 5:11 PM     Please page though AMION or Epic secure chat:  For [employer], Higher education careers adviser

## 2021-06-13 LAB — CBC
HCT: 39.3 % (ref 39.0–52.0)
Hemoglobin: 12.7 g/dL — ABNORMAL LOW (ref 13.0–17.0)
MCH: 29.8 pg (ref 26.0–34.0)
MCHC: 32.3 g/dL (ref 30.0–36.0)
MCV: 92.3 fL (ref 80.0–100.0)
Platelets: 203 10*3/uL (ref 150–400)
RBC: 4.26 MIL/uL (ref 4.22–5.81)
RDW: 15.8 % — ABNORMAL HIGH (ref 11.5–15.5)
WBC: 7.6 10*3/uL (ref 4.0–10.5)
nRBC: 0 % (ref 0.0–0.2)

## 2021-06-13 NOTE — TOC Progression Note (Signed)
Transition of Care [facility]) - Progression Note    Patient Details  Name: Darrell Collier MRN: [id number] Date of Birth: 1951-08-04  Transition of [facility]) CM/SW Contact  Allayne Butcher, RN Phone Number: 06/13/2021, 3:39 PM  Clinical Narrative:    Insurance authorization is still pending.   Expected Discharge Plan: Skilled Nursing Facility Barriers to Discharge: Insurance Authorization  Expected Discharge Plan and Services Expected Discharge Plan: Skilled Nursing Facility   Discharge Planning Services: CM Consult Post Acute Care Choice: Skilled Nursing Facility Living arrangements for the past 2 months: Single Family Home Expected Discharge Date: 06/10/21               DME Arranged: N/A DME Agency: NA                   Social Determinants of Health (SDOH) Interventions    Readmission Risk Interventions No flowsheet data found.

## 2021-06-13 NOTE — Progress Notes (Signed)
Darrell Collier    Darrell Collier  JFH:[id number] DOB: February 28, 1951 DOA: 06/07/2021 PCP: Danella Penton, MD    Brief Narrative:  Mr. Stogsdill is a 70 y.o. M with HTN, A. Fib on Eliquis who presented with aphasia.  Patient lives alone, evidently was in her normal state of health until several days prior to admission when father had lunch with him.  Then on the day of admission the patient showed up to his PCPs office talking unintelligibly and was sent to the ER.  In the ER, he was in A. fib with rapid rate, started on diltiazem drip.  Blood pressure elevated.  CT of the head showed a subtle low-density in the posterior left temporal lobe.  Neurology and cardiology were consulted and the patient was admitted for stroke. MRI with an acute infarct in posterior left MCA territory, without hemorrhagic conversion. Noninvasive angiography showed severe bilateral PCA stenoses, no other large vessel occlusion, no MCA stenosis, no carotid disease.  Presumably embolic.  Patient now able to articulate that this happened 2 to 3 days prior to admission, so likely 8/2.  He also explains that 6 months ago he had gross hematuria and stopped his Eliquis, has not restarted.  Patient continues to have expressive aphasia, at times talking okay and at times becoming confused. Echocardiogram with no cardiogenic source. Started on aspirin and statin.  Unsafe discharge as there is no one in the family who can provide 24/7 care at this time.  Waiting for insurance authorization for SNF.  Subjective: Patient was able to speak more coherently today.  At times having word finding difficulties and expressive difficulties.  No other new deficit.  Assessment & Plan:  Left posterior MCA stroke.  Expressive aphasia seems improving.  Intermittently having some word finding difficulties.  PT is recommending rehab as no one in the family can provide 24/7 care at this time.  Waiting for insurance  authorization. - Continue atorvastatin - Continue Eliquis  Atrial fibrillation with RVR Persistent atrial fibrillation Rate normal - Continue metoprolol -Continue with Eliquis which was restarted during current hospitalization.  Hypertnesion BP improved - Cotninue new amlodipine -Continue metoprolol  CKD IIIb Cr at baseline 1.5-2.0    Disposition: Status is: Inpatient  Remains inpatient appropriate because:Unsafe d/c plan  Dispo: The patient is from: Home              Anticipated d/c is to: SNF              Patient currently is medically stable.   Difficult to place patient No   Patient was admitted with embolic stroke.  Given his apraxia, receptive and expressive aphasia, and functional deficits the patient is unable to function safely alone, and requires skilled nursing level of supervision for  intensive speech therapy and rehabilitation setting prior to returning home   We are applying for authorization for SNF now.   Level of care: Med-Surg  MDM: The below labs and imaging reports were reviewed and summarized above.  Medication management as above.    DVT prophylaxis:  apixaban (ELIQUIS) tablet 5 mg  Code Status: FULL Family Communication:   Consultants:  Neurology Cardiology   Procedures:  8/4 echo -- normal EF, normal valves 8/4 CTA head and neck -- only PCA bilateral disase, no LVO 8/4 MRI brain -- left posterior MCA stroke 8/7 CT head-no hemorrhage  Objective: Vitals:   06/12/21 2013 06/13/21 0420 06/13/21 0801 06/13/21 1120  BP: (!) 144/99 116/77 Marland Kitchen)  143/72 (!) 126/101  Pulse: 81 79 74 76  Resp: 16 17 20 16   Temp:  97.9 F (36.6 C) 98.5 F (36.9 C) 98.4 F (36.9 C)  TempSrc: Oral  Oral   SpO2: 98% 99% 99% 98%  Weight:      Height:        Intake/Output Summary (Last 24 hours) at 06/13/2021 1506 Last data filed at 06/13/2021 1300 Gross per 24 hour  Intake 600 ml  Output --  Net 600 ml     Filed Weights   06/07/21 0841 06/07/21  2239  Weight: 86.2 kg 88.6 kg    Examination: General.  Well-developed gentleman, in no acute distress. Pulmonary.  Lungs clear bilaterally, normal respiratory effort. CV.  Irregularly irregular, no JVD, rub or murmur. Abdomen.  Soft, nontender, nondistended, BS positive. CNS.  Alert and oriented x3.  No focal neurologic deficit. Extremities.  No edema, no cyanosis, pulses intact and symmetrical. Psychiatry.  Judgment and insight appears normal.    Data Reviewed: I have personally reviewed following labs and imaging studies:  CBC: Recent Labs  Lab 06/07/21 0845 06/13/21 0500  WBC 8.5 7.6  NEUTROABS 5.5  --   HGB 14.4 12.7*  HCT 42.4 39.3  MCV 90.4 92.3  PLT 178 203    Basic Metabolic Panel: Recent Labs  Lab 06/07/21 0845  NA 140  K 3.6  CL 104  CO2 23  GLUCOSE 83  BUN 26*  CREATININE 1.46*  CALCIUM 9.0  MG 1.7    GFR: Estimated Creatinine Clearance: 54.7 mL/min (A) (by C-G formula based on SCr of 1.46 mg/dL (H)). Liver Function Tests: Recent Labs  Lab 06/07/21 0845  AST 21  ALT 12  ALKPHOS 89  BILITOT 1.7*  PROT 7.7  ALBUMIN 3.8    No results for input(s): LIPASE, AMYLASE in the last 168 hours. Recent Labs  Lab 06/08/21 0415  AMMONIA 21    Coagulation Profile: Recent Labs  Lab 06/07/21 0845  INR 1.1    Cardiac Enzymes: No results for input(s): CKTOTAL, CKMB, CKMBINDEX, TROPONINI in the last 168 hours. BNP (last 3 results) No results for input(s): PROBNP in the last 8760 hours. HbA1C: No results for input(s): HGBA1C in the last 72 hours.  CBG: Recent Labs  Lab 06/07/21 0834  GLUCAP 74    Lipid Profile: No results for input(s): CHOL, HDL, LDLCALC, TRIG, CHOLHDL, LDLDIRECT in the last 72 hours.  Thyroid Function Tests: No results for input(s): TSH, T4TOTAL, FREET4, T3FREE, THYROIDAB in the last 72 hours. Anemia Panel: No results for input(s): VITAMINB12, FOLATE, FERRITIN, TIBC, IRON, RETICCTPCT in the last 72 hours. Urine  analysis:    Component Value Date/Time   COLORURINE YELLOW (A) 06/07/2021 1024   APPEARANCEUR CLEAR (A) 06/07/2021 1024   LABSPEC 1.044 (H) 06/07/2021 1024   PHURINE 5.0 06/07/2021 1024   GLUCOSEU NEGATIVE 06/07/2021 1024   HGBUR SMALL (A) 06/07/2021 1024   BILIRUBINUR NEGATIVE 06/07/2021 1024   KETONESUR 20 (A) 06/07/2021 1024   PROTEINUR NEGATIVE 06/07/2021 1024   NITRITE NEGATIVE 06/07/2021 1024   LEUKOCYTESUR NEGATIVE 06/07/2021 1024   Sepsis Labs: @LABRCNTIP (procalcitonin:4,lacticacidven:4)  ) Recent Results (from the past 240 hour(s))  Urine Culture     Status: None   Collection Time: 06/07/21 10:24 AM   Specimen: Urine, Catheterized  Result Value Ref Range Status   Specimen Description   Final    URINE, CATHETERIZED Performed at [facility], [address], [place], [address] [place]    Special  Requests   Final    Normal Performed at [facility], [address] [place], [place] [address]    Culture   Final    NO GROWTH Performed at [facility], [address] [place], [place] [address]    Report Status 06/08/2021 FINAL  Final  Resp Panel by RT-PCR (Flu A&B, Covid) Nasopharyngeal Swab     Status: None   Collection Time: 06/07/21  1:01 PM   Specimen: Nasopharyngeal Swab; Nasopharyngeal(NP) swabs in vial transport medium  Result Value Ref Range Status   SARS Coronavirus 2 by RT PCR NEGATIVE NEGATIVE Final    Comment: (Collier) SARS-CoV-2 target nucleic acids are NOT DETECTED.  The SARS-CoV-2 RNA is generally detectable in upper respiratory specimens during the acute phase of infection. The lowest concentration of SARS-CoV-2 viral copies this assay can detect is 138 copies/mL. A negative result does not preclude SARS-Cov-2 infection and should not be used as the sole basis for treatment or other patient management decisions. A negative result may occur with  improper specimen collection/handling, submission of specimen other than  nasopharyngeal swab, presence of viral mutation(s) within the areas targeted by this assay, and inadequate number of viral copies(<138 copies/mL). A negative result must be combined with clinical observations, patient history, and epidemiological information. The expected result is Negative.  Fact Sheet for Patients:  BloggerCourse.comhttps://www.fda.gov/media/152166/download  Fact Sheet for Healthcare Providers:  SeriousBroker.ithttps://www.fda.gov/media/152162/download  This test is no t yet approved or cleared by the [place] FDA and  has been authorized for detection and/or diagnosis of SARS-CoV-2 by FDA under an Emergency Use Authorization (EUA). This EUA will remain  in effect (meaning this test can be used) for the duration of the COVID-19 declaration under Section 564(b)(1) of the Act, 21 U.S.C.section 360bbb-3(b)(1), unless the authorization is terminated  or revoked sooner.       Influenza A by PCR NEGATIVE NEGATIVE Final   Influenza B by PCR NEGATIVE NEGATIVE Final    Comment: (Collier) The Xpert Xpress SARS-CoV-2/FLU/RSV plus assay is intended as an aid in the diagnosis of influenza from Nasopharyngeal swab specimens and should not be used as a sole basis for treatment. Nasal washings and aspirates are unacceptable for Xpert Xpress SARS-CoV-2/FLU/RSV testing.  Fact Sheet for Patients: BloggerCourse.comhttps://www.fda.gov/media/152166/download  Fact Sheet for Healthcare Providers: SeriousBroker.ithttps://www.fda.gov/media/152162/download  This test is not yet approved or cleared by the [place] FDA and has been authorized for detection and/or diagnosis of SARS-CoV-2 by FDA under an Emergency Use Authorization (EUA). This EUA will remain in effect (meaning this test can be used) for the duration of the COVID-19 declaration under Section 564(b)(1) of the Act, 21 U.S.C. section 360bbb-3(b)(1), unless the authorization is terminated or revoked.  Performed at [facility], [address] [place], [place]  [address]      Radiology Studies: No results found.  Scheduled Meds:  (feeding supplement) PROSource Plus  30 mL Oral BID BM   amLODipine  5 mg Oral Daily   apixaban  5 mg Oral BID   atorvastatin  80 mg Oral Daily   metoprolol tartrate  50 mg Oral BID   multivitamin with minerals  1 tablet Oral Daily   Continuous Infusions:   LOS: 6 days    Time spent: 35 minutes This record has been created using Conservation officer, historic buildingsDragon voice recognition software. Errors have been sought and corrected,but may not always be located. Such creation errors do not reflect on the standard of care.    Arnetha CourserSumayya Naima Veldhuizen, MD  [facility] 06/13/2021, 3:06 PM     Please page though AMION or Epic secure chat:  For [employer], Higher education careers adviser

## 2021-06-14 LAB — RESP PANEL BY RT-PCR (FLU A&B, COVID) ARPGX2
Influenza A by PCR: NEGATIVE
Influenza B by PCR: NEGATIVE
SARS Coronavirus 2 by RT PCR: NEGATIVE

## 2021-06-14 MED ORDER — PROSOURCE PLUS PO LIQD: 30.0000 mL | Freq: Two times a day (BID) | ORAL | Status: AC

## 2021-06-14 MED ORDER — COVID-19 MRNA VAC-TRIS(PFIZER) 30 MCG/0.3ML IM SUSP
0.3000 mL | Freq: Once | INTRAMUSCULAR | Status: AC
  Administered 2021-06-14: 13:00:00 0.3 mL via INTRAMUSCULAR
  Filled 2021-06-14: qty 0.3

## 2021-06-14 MED ORDER — ADULT MULTIVITAMIN W/MINERALS CH: 1.0000 | ORAL_TABLET | Freq: Every day | ORAL | Status: AC

## 2021-06-14 MED ORDER — ATORVASTATIN CALCIUM 80 MG PO TABS: 80.0000 mg | ORAL_TABLET | Freq: Every day | ORAL | Status: DC

## 2021-06-14 NOTE — Progress Notes (Signed)
Report called to Tiffany at [facility]

## 2021-06-14 NOTE — Care Management Important Message (Signed)
Important Message  Patient Details  Name: Darrell Collier MRN: [id number] Date of Birth: 1950-12-17   Medicare Important Message Given:  Yes     Olegario Messier A Shavy Beachem 06/14/2021, 10:58 AM

## 2021-06-14 NOTE — TOC Transition Note (Signed)
Transition of Care [facility]) - CM/SW Discharge Note   Patient Details  Name: Darrell Collier MRN: [id number] Date of Birth: 11/22/1950  Transition of Care [facility]) CM/SW Contact:  Allayne Butcher, RN Phone Number: 06/14/2021, 2:53 PM   Clinical Narrative:    Patient medically cleared for discharge and [facility] and rehabilitation have received auth.  Patient going to room  413A, family updated on discharge.  [facility] picking patient up and taking him to [place].  Patient received COVID booster today and COVID test negative.  Final next level of care: Skilled Nursing Facility Barriers to Discharge: Barriers Resolved   Patient Goals and CMS Choice Patient states their goals for this hospitalization and ongoing recovery are:: patient unable to state goals CMS Medicare.gov Compare Post Acute Care list provided to:: Patient Represenative (must comment) Choice offered to / list presented to : Parent  Discharge Placement PASRR number recieved: 06/11/21            Patient chooses bed at: [facility] Patient to be transferred to facility by: [facility] Name of family member notified: Lymon Kidney (dad) Patient and family notified of of transfer: 06/14/21  Discharge Plan and Services   Discharge Planning Services: CM Consult Post Acute Care Choice: Skilled Nursing Facility          DME Arranged: N/A DME Agency: NA                  Social Determinants of Health (SDOH) Interventions     Readmission Risk Interventions No flowsheet data found.

## 2021-06-14 NOTE — Discharge Summary (Signed)
Physician Discharge Summary  Darrell Collier WPY:[id number] DOB: 1951/07/01 DOA: 06/07/2021  PCP: Danella Penton, MD  Admit date: 06/07/2021 Discharge date: 06/14/2021  Admitted From: Home Disposition: SNF  Recommendations for Outpatient Follow-up:  Follow up with PCP in 1-2 weeks Please obtain BMP/CBC in one week Please follow up on the following pending results: None  Home Health: No Equipment/Devices: None Discharge Condition: Stable CODE STATUS: Full Diet recommendation: Heart Healthy / Carb Modified   Brief/Interim Summary: Darrell Collier is a 70 y.o. M with HTN, A. Fib on Eliquis who presented with aphasia.   Patient lives alone, evidently was in her normal state of health until several days prior to admission when father had lunch with him.  Then on the day of admission the patient showed up to his PCPs office talking unintelligibly and was sent to the ER.   In the ER, he was in A. fib with rapid rate, started on diltiazem drip.  Blood pressure elevated.  CT of the head showed a subtle low-density in the posterior left temporal lobe.  Neurology and cardiology were consulted and the patient was admitted for stroke. MRI with an acute infarct in posterior left MCA territory, without hemorrhagic conversion. Noninvasive angiography showed severe bilateral PCA stenoses, no other large vessel occlusion, no MCA stenosis, no carotid disease.   Presumably embolic.  Patient now able to articulate that this happened 2 to 3 days prior to admission, so likely 8/2.  He also explains that 6 months ago he had gross hematuria and stopped his Eliquis, has not restarted.   Patient continues to have expressive aphasia, at times talking okay and at times becoming confused. Echocardiogram with no cardiogenic source. Started on Eliquis and statin.  Holding off antiplatelets in order to decrease the chance of bleeding.  At the time of discharge his speech was improving.  Unsafe discharge as there was no  family who can provide 24/7 care.  He is being discharged to rehab for further recommendations before returning home.  Patient has persistent atrial fibrillation, rate well controlled with metoprolol and he was also restarted on Eliquis.  In order to better control of his hypertension, low-dose amlodipine was ordered.  Patient will need a close follow-up with PCP for further recommendations.  Discharge Diagnoses:  Active Problems:   Atrial fibrillation with RVR (HCC)   Cerebrovascular accident [facility])   Discharge Instructions  Discharge Instructions     Diet - low sodium heart healthy   Complete by: As directed    Discharge instructions   Complete by: As directed    From Dr. Maryfrances Bunnell: Your father was admitted for a stroke This stroke affected his language centers, partly the language centers he uses for speaking, but mostly the language centers he uses for hearing/understanding and reading. This may improve partly.     Resume apixaban/Eliquis Do not stop this medicine without discussing with Cardiologist or Dr. Hyacinth Meeker  Go see Dr. Hyacinth Meeker in 1 week Go see the new neurologist (Dr. Sherryll Burger) in 1 month  Call your cardiologist for a follow up appointment in 3-4 weeks  Resume your other home medicines I have refilled your atorvastatin and metoprolol Start the new blood pressure medicine amlodipine and ask Dr. Hyacinth Meeker if you should continue it   Increase activity slowly   Complete by: As directed    Increase activity slowly   Complete by: As directed       Allergies as of 06/14/2021   No Known Allergies  Medication List     TAKE these medications    (feeding supplement) PROSource Plus liquid Take 30 mLs by mouth 2 (two) times daily between meals.   amLODipine 5 MG tablet Commonly known as: NORVASC Take 1 tablet (5 mg total) by mouth daily.   apixaban 5 MG Tabs tablet Commonly known as: ELIQUIS Take 1 tablet (5 mg total) by mouth 2 (two) times daily.    atorvastatin 80 MG tablet Commonly known as: LIPITOR Take 1 tablet (80 mg total) by mouth daily. Start taking on: June 15, 2021 What changed:  medication strength how much to take   metoprolol succinate 50 MG 24 hr tablet Commonly known as: TOPROL-XL Take 1 tablet (50 mg total) by mouth 2 (two) times daily.   multivitamin with minerals Tabs tablet Take 1 tablet by mouth daily. Start taking on: June 15, 2021        Contact information for follow-up providers     Danella Penton, MD. Schedule an appointment as soon as possible for a visit in 1 week(s).   Specialty: Internal Medicine Contact information: [phone] [facility] [address] [facility] [place] [address] [phone]         Lonell Face, MD. Schedule an appointment as soon as possible for a visit in 1 month(s).   Specialty: Neurology Contact information: [phone] [facility] [address] [facility] [place] [address] [phone]              Contact information for after-discharge care     Destination     [facility] .   Service: Skilled Nursing Contact information: [address] [place] [address] [phone]                    No Known Allergies  Consultations: Neurology Cardiology  Procedures/Studies:  CT HEAD WO CONTRAST ( )  Result Date: 06/10/2021 CLINICAL DATA:  Cerebral hemorrhage suspected EXAM: CT HEAD WITHOUT CONTRAST TECHNIQUE: Contiguous axial images were obtained from the base of the skull through the vertex without intravenous contrast. COMPARISON:  Brain MRI 3 days ago FINDINGS: Brain: Progressively distinct left parietal infarct, MCA branch territory. No progression when compared to prior MRI. No acute hemorrhage. Chronic lacunar infarct at the right putamen, right caudate, and right thalamus. No hydrocephalus or collection. Vascular: No hyperdense vessel or unexpected  calcification. Skull: Normal. Negative for fracture or focal lesion. Sinuses/Orbits: No acute finding. IMPRESSION: 1. Acute left MCA branch infarct.  No hemorrhage or progression. 2. Chronic lacunar infarcts. Electronically Signed   By: Marnee Spring M.D.   On: 06/10/2021 08:47   CT HEAD WO CONTRAST  Result Date: 06/07/2021 CLINICAL DATA:  Altered mental status EXAM: CT HEAD WITHOUT CONTRAST TECHNIQUE: Contiguous axial images were obtained from the base of the skull through the vertex without intravenous contrast. COMPARISON:  None. FINDINGS: Brain: Subtle low-density noted in the posterior left temporal lobe concerning for early acute infarction. No hemorrhage. Chronic small vessel disease throughout the deep white matter. 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 Vascular: No hyperdense vessel or unexpected calcification. Skull: No acute calvarial abnormality. Sinuses/Orbits: Visualized paranasal sinuses and mastoids clear. Orbital soft tissues unremarkable. Other: None IMPRESSION: Subtle low-density in the posterior left temporal lobe concerning for early acute infarction. This could be further evaluated with MRI if felt clinically indicated. 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 Chronic small vessel disease. Electronically Signed   By: Charlett Nose M.D.   On: 06/07/2021 09:01  MR BRAIN WO CONTRAST  Result Date: 06/07/2021 CLINICAL DATA:  Acute neurologic deficit EXAM: MRI HEAD WITHOUT CONTRAST TECHNIQUE: Multiplanar, multiecho pulse sequences of the brain and surrounding structures were obtained without intravenous contrast. COMPARISON:  None. FINDINGS: Brain: Acute infarct within the posterior left MCA territory. Multiple falls a of chronic microhemorrhage in the left cerebellum. There is multifocal hyperintense T2-weighted signal within the white matter. Generalized volume loss without a clear lobar predilection. The midline structures are normal. There is an old right cerebellar infarct.  Vascular: Major flow voids are preserved. Skull and upper cervical spine: Normal calvarium and skull base. Visualized upper cervical spine and soft tissues are normal. Sinuses/Orbits:No paranasal sinus fluid levels or advanced mucosal thickening. No mastoid or middle ear effusion. Normal orbits. 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 Electronically Signed   By: Deatra Robinson M.D.   On: 06/07/2021 21:37   ECHOCARDIOGRAM COMPLETE BUBBLE STUDY  Result Date: 06/07/2021    ECHOCARDIOGRAM REPORT   Patient Name:   Darrell Collier Date of Exam: 06/07/2021 Medical Rec #:  [id number]       Height:       73.0 in Accession #:    [id number]      Weight:       190.0 lb Date of Birth:  11-Apr-1951       BSA:          2.105 m Patient Age:    70 years        BP:           166/120 mmHg Patient Gender: M               HR:           127 bpm. Exam Location:  [facility] Procedure: 2D Echo, Cardiac Doppler, Color Doppler and Saline Contrast Bubble            Study Indications:     Stroke 434.91 / I63.9  History:         Patient has no prior history of Echocardiogram examinations.                  Arrythmias:Atrial Fibrillation; Risk Factors:Hypertension.  Sonographer:     [name] RDCS (AE) Referring Phys:  [id number] [facility] Diagnosing Phys: Julien Nordmann MD  Sonographer Comments: Suboptimal apical window. Image acquisition challenging due to respiratory motion. IMPRESSIONS  1. Left ventricular ejection fraction, by estimation, is 60 to 65%. The left ventricle has normal function. The left ventricle has no regional wall motion abnormalities. Left ventricular diastolic parameters are indeterminate.  2. Right ventricular systolic function is normal. The right ventricular size is normal.  3. The mitral valve is normal in structure. Mild mitral valve regurgitation. No evidence of mitral stenosis.  4. Agitated saline contrast bubble study was negative, with no evidence of any  interatrial shunt. FINDINGS  Left Ventricle: Left ventricular ejection fraction, by estimation, is 60 to 65%. The left ventricle has normal function. The left ventricle has no regional wall motion abnormalities. The left ventricular internal cavity size was normal in size. There is  no left ventricular hypertrophy. Left ventricular diastolic parameters are indeterminate. Right Ventricle: The right ventricular size is normal. No increase in right ventricular wall thickness. Right ventricular systolic function is normal. Left Atrium: Left atrial size was normal in size. Right Atrium: Right atrial size was normal in size. Pericardium: There is no evidence of pericardial effusion. Mitral Valve:  The mitral valve is normal in structure. Mild mitral valve regurgitation. No evidence of mitral valve stenosis. Tricuspid Valve: The tricuspid valve is normal in structure. Tricuspid valve regurgitation is mild . No evidence of tricuspid stenosis. Aortic Valve: The aortic valve is normal in structure. Aortic valve regurgitation is not visualized. No aortic stenosis is present. Aortic valve mean gradient measures 3.0 mmHg. Aortic valve peak gradient measures 4.5 mmHg. Aortic valve area, by VTI measures 2.48 cm. Pulmonic Valve: The pulmonic valve was normal in structure. Pulmonic valve regurgitation is not visualized. No evidence of pulmonic stenosis. Aorta: The aortic root is normal in size and structure. Venous: The inferior vena cava is normal in size with greater than 50% respiratory variability, suggesting right atrial pressure of 3 mmHg. IAS/Shunts: No atrial level shunt detected by color flow Doppler. Agitated saline contrast was given intravenously to evaluate for intracardiac shunting. Agitated saline contrast bubble study was negative, with no evidence of any interatrial shunt.  LEFT VENTRICLE PLAX 2D LVIDd:         3.91 cm LVIDs:         2.53 cm LV PW:         1.20 cm LV IVS:        0.82 cm LVOT diam:     2.00 cm LV SV:          37 LV SV Index:   17 LVOT Area:     3.14 cm  RIGHT VENTRICLE RV Basal diam:  4.12 cm RV S prime:     14.40 cm/s TAPSE (M-mode): 3.3 cm LEFT ATRIUM              Index       RIGHT ATRIUM           Index LA diam:        3.70 cm  1.76 cm/m  RA Area:     23.60 cm LA Vol (A2C):   45.6 ml  21.66 ml/m RA Volume:   88.70 ml  42.13 ml/m LA Vol (A4C):   103.0 ml 48.93 ml/m LA Biplane Vol: 75.4 ml  35.82 ml/m  AORTIC VALVE                   PULMONIC VALVE AV Area (Vmax):    1.95 cm    PV Vmax:        0.44 m/s AV Area (Vmean):   1.92 cm    PV Peak grad:   0.8 mmHg AV Area (VTI):     2.48 cm    RVOT Peak grad: 4 mmHg AV Vmax:           106.00 cm/s AV Vmean:          76.100 cm/s AV VTI:            0.148 m AV Peak Grad:      4.5 mmHg AV Mean Grad:      3.0 mmHg LVOT Vmax:         65.90 cm/s LVOT Vmean:        46.400 cm/s LVOT VTI:          0.117 m LVOT/AV VTI ratio: 0.79  AORTA Ao Root diam: 2.70 cm MITRAL VALVE               TRICUSPID VALVE MV Area (PHT): 5.09 cm    TR Peak grad:   18.3 mmHg MV Decel Time: 149 msec    TR Vmax:  214.00 cm/s MV E velocity: 87.40 cm/s                            SHUNTS                            Systemic VTI:  0.12 m                            Systemic Diam: 2.00 cm Julien Nordmannimothy Gollan MD Electronically signed by Julien Nordmannimothy Gollan MD Signature Date/Time: 06/07/2021/2:18:32 PM    Final    CT ANGIO HEAD NECK W WO CM (CODE STROKE)  Result Date: 06/07/2021 CLINICAL DATA:  Stroke.  Mental status change. EXAM: CT ANGIOGRAPHY HEAD AND NECK TECHNIQUE: Multidetector CT imaging of the head and neck was performed using the standard protocol during bolus administration of intravenous contrast. Multiplanar CT image reconstructions and MIPs were obtained to evaluate the vascular anatomy. Carotid stenosis measurements (when applicable) are obtained utilizing NASCET criteria, using the distal internal carotid diameter as the denominator. CONTRAST:  75mL OMNIPAQUE IOHEXOL 350 MG/ML SOLN COMPARISON:   Carotid Doppler ultrasound 08/01/2020 FINDINGS: CTA NECK FINDINGS Aortic arch: Standard 3 vessel aortic arch with widely patent arch vessel origins. Right carotid system: Patent with a small amount of calcified and soft plaque at the carotid bifurcation. No evidence of a dissection or significant stenosis. Left carotid system: Patent with predominantly soft plaque in the carotid bulb which is mildly ulcerated. No evidence of a significant stenosis or dissection. Vertebral arteries: Patent without evidence of a dissection, stenosis, or significant atherosclerosis. Slightly dominant left vertebral artery. Skeleton: Moderate cervical disc and facet degeneration. Other neck: Bilateral thyroid nodules including an approximately 2.9 cm nodule on the left. Upper chest: Mild centrilobular emphysema. Review of the MIP images confirms the above findings CTA HEAD FINDINGS Anterior circulation: The internal carotid arteries are patent from skull base to carotid termini with mild atherosclerotic plaque bilaterally and a mild stenosis of the proximal left supraclinoid ICA. ACAs and MCAs are patent without evidence of a proximal branch occlusion or significant proximal stenosis. No aneurysm is identified. Posterior circulation: The intracranial vertebral arteries are widely patent to the basilar. Patent PICA, AICA, and SCA origins are seen bilaterally. The basilar artery is widely patent. There is a diminutive left posterior communicating artery. Both PCAs are patent. Stenoses near the P1-P2 junctions are severe on the right and mild on the left. No aneurysm is identified. Venous sinuses: Patent. Anatomic variants: None. Review of the MIP images confirms the above findings IMPRESSION: 1. No large vessel occlusion. 2. Cervical carotid atherosclerosis including ulcerated plaque in the left carotid bulb. No significant stenosis or dissection. 3. 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 4.  Emphysema (ICD10-J43.9). These  results were called by telephone at the time of interpretation on 06/07/2021 at 10:09 am to Dr. Antoine PrimasZachary Smith, who verbally acknowledged these results. Electronically Signed   By: Sebastian AcheAllen  Grady M.D.   On: 06/07/2021 10:18    Subjective: Patient was seen and examined today.  Speech improving.  Able to communicate now.  At times having some expressive aphasia.  He was talking with his dad on phone when seen today.  He wants to get out of here.  Discharge Exam: Vitals:   06/14/21 0342 06/14/21 0735  BP: (!) 127/97 135/82  Pulse: 60 62  Resp: 18 16  Temp: 97.8 F (36.6 C) 98 F (36.7 C)  SpO2: 98% 99%   Vitals:   06/13/21 2031 06/14/21 0123 06/14/21 0342 06/14/21 0735  BP: 126/86 133/87 (!) 127/97 135/82  Pulse: 77 (!) 108 60 62  Resp: Temp: 98.6 F (37 C) 98.2 F (36.8 C) 97.8 F (36.6 C) 98 F (36.7 C)  TempSrc: Oral Oral Oral   SpO2: 98% 97% 98% 99%  Weight:      Height:        General: Pt is alert, awake, not in acute distress Cardiovascular: RRR, S1/S2 +, no rubs, no gallops Respiratory: CTA bilaterally, no wheezing, no rhonchi Abdominal: Soft, NT, ND, bowel sounds + Extremities: no edema, no cyanosis   The results of significant diagnostics from this hospitalization (including imaging, microbiology, ancillary and laboratory) are listed below for reference.    Microbiology: Recent Results (from the past 240 hour(s))  Urine Culture     Status: None   Collection Time: 06/07/21 10:24 AM   Specimen: Urine, Catheterized  Result Value Ref Range Status   Specimen Description   Final    URINE, CATHETERIZED Performed at [facility], [address], [place], [place] [address]    Special Requests   Final    Normal Performed at [facility], [address], [place], [place] [address]    Culture   Final    NO GROWTH Performed at [facility], [address] [place], [place] [address]    Report Status 06/08/2021 FINAL  Final  Resp  Panel by RT-PCR (Flu A&B, Covid) Nasopharyngeal Swab     Status: None   Collection Time: 06/07/21  1:01 PM   Specimen: Nasopharyngeal Swab; Nasopharyngeal(NP) swabs in vial transport medium  Result Value Ref Range Status   SARS Coronavirus 2 by RT PCR NEGATIVE NEGATIVE Final    Comment: (NOTE) SARS-CoV-2 target nucleic acids are NOT DETECTED.  The SARS-CoV-2 RNA is generally detectable in upper respiratory specimens during the acute phase of infection. The lowest concentration of SARS-CoV-2 viral copies this assay can detect is 138 copies/mL. A negative result does not preclude SARS-Cov-2 infection and should not be used as the sole basis for treatment or other patient management decisions. A negative result may occur with  improper specimen collection/handling, submission of specimen other than nasopharyngeal swab, presence of viral mutation(s) within the areas targeted by this assay, and inadequate number of viral copies(<138 copies/mL). A negative result must be combined with clinical observations, patient history, and epidemiological information. The expected result is Negative.  Fact Sheet for Patients:  BloggerCourse.com  Fact Sheet for Healthcare Providers:  SeriousBroker.it  This test is no t yet approved or cleared by the [place] FDA and  has been authorized for detection and/or diagnosis of SARS-CoV-2 by FDA under an Emergency Use Authorization (EUA). This EUA will remain  in effect (meaning this test can be used) for the duration of the COVID-19 declaration under Section 564(b)(1) of the Act, 21 U.S.C.section 360bbb-3(b)(1), unless the authorization is terminated  or revoked sooner.       Influenza A by PCR NEGATIVE NEGATIVE Final   Influenza B by PCR NEGATIVE NEGATIVE Final    Comment: (NOTE) The Xpert Xpress SARS-CoV-2/FLU/RSV plus assay is intended as an aid in the diagnosis of influenza from Nasopharyngeal  swab specimens and should not be used as a sole basis for treatment. Nasal washings and aspirates are unacceptable for Xpert Xpress SARS-CoV-2/FLU/RSV testing.  Fact Sheet for Patients: BloggerCourse.com  Fact Sheet for Healthcare Providers: SeriousBroker.it  This test is not yet approved or cleared by the [place] FDA and has been authorized for detection and/or diagnosis of SARS-CoV-2 by FDA under an Emergency Use Authorization (EUA). This EUA will remain in effect (meaning this test can be used) for the duration of the COVID-19 declaration under Section 564(b)(1) of the Act, 21 U.S.C. section 360bbb-3(b)(1), unless the authorization is terminated or revoked.  Performed at [facility], [address] [place], [place] [address]      Labs: BNP (last 3 results) No results for input(s): BNP in the last 8760 hours. Basic Metabolic Panel: No results for input(s): NA, K, CL, CO2, GLUCOSE, BUN, CREATININE, CALCIUM, MG, PHOS in the last 168 hours. Liver Function Tests: No results for input(s): AST, ALT, ALKPHOS, BILITOT, PROT, ALBUMIN in the last 168 hours. No results for input(s): LIPASE, AMYLASE in the last 168 hours. Recent Labs  Lab 06/08/21 0415  AMMONIA 21   CBC: Recent Labs  Lab 06/13/21 0500  WBC 7.6  HGB 12.7*  HCT 39.3  MCV 92.3  PLT 203   Cardiac Enzymes: No results for input(s): CKTOTAL, CKMB, CKMBINDEX, TROPONINI in the last 168 hours. BNP: Invalid input(s): POCBNP CBG: No results for input(s): GLUCAP in the last 168 hours. D-Dimer No results for input(s): DDIMER in the last 72 hours. Hgb A1c No results for input(s): HGBA1C in the last 72 hours. Lipid Profile No results for input(s): CHOL, HDL, LDLCALC, TRIG, CHOLHDL, LDLDIRECT in the last 72 hours. Thyroid function studies No results for input(s): TSH, T4TOTAL, T3FREE, THYROIDAB in the last 72 hours.  Invalid input(s): FREET3 Anemia  work up No results for input(s): VITAMINB12, FOLATE, FERRITIN, TIBC, IRON, RETICCTPCT in the last 72 hours. Urinalysis    Component Value Date/Time   COLORURINE YELLOW (A) 06/07/2021 1024   APPEARANCEUR CLEAR (A) 06/07/2021 1024   LABSPEC 1.044 (H) 06/07/2021 1024   PHURINE 5.0 06/07/2021 1024   GLUCOSEU NEGATIVE 06/07/2021 1024   HGBUR SMALL (A) 06/07/2021 1024   BILIRUBINUR NEGATIVE 06/07/2021 1024   KETONESUR 20 (A) 06/07/2021 1024   PROTEINUR NEGATIVE 06/07/2021 1024   NITRITE NEGATIVE 06/07/2021 1024   LEUKOCYTESUR NEGATIVE 06/07/2021 1024   Sepsis Labs Invalid input(s): PROCALCITONIN,  WBC,  LACTICIDVEN Microbiology Recent Results (from the past 240 hour(s))  Urine Culture     Status: None   Collection Time: 06/07/21 10:24 AM   Specimen: Urine, Catheterized  Result Value Ref Range Status   Specimen Description   Final    URINE, CATHETERIZED Performed at [facility], [address], [place], [place] [address]    Special Requests   Final    Normal Performed at [facility], [address] [place], [place] [address]    Culture   Final    NO GROWTH Performed at [facility], [address] [place], [place] [address]    Report Status 06/08/2021 FINAL  Final  Resp Panel by RT-PCR (Flu A&B, Covid) Nasopharyngeal Swab     Status: None   Collection Time: 06/07/21  1:01 PM   Specimen: Nasopharyngeal Swab; Nasopharyngeal(NP) swabs in vial transport medium  Result Value Ref Range Status   SARS Coronavirus 2 by RT PCR NEGATIVE NEGATIVE Final    Comment: (NOTE) SARS-CoV-2 target nucleic acids are NOT DETECTED.  The SARS-CoV-2 RNA is generally detectable in upper respiratory specimens during the acute phase of infection. The lowest concentration of SARS-CoV-2  viral copies this assay can detect is 138 copies/mL. A negative result does not preclude SARS-Cov-2 infection and should not be used as the sole basis for treatment or other patient  management decisions. A negative result may occur with  improper specimen collection/handling, submission of specimen other than nasopharyngeal swab, presence of viral mutation(s) within the areas targeted by this assay, and inadequate number of viral copies(<138 copies/mL). A negative result must be combined with clinical observations, patient history, and epidemiological information. The expected result is Negative.  Fact Sheet for Patients:  BloggerCourse.com  Fact Sheet for Healthcare Providers:  SeriousBroker.it  This test is no t yet approved or cleared by the [place] FDA and  has been authorized for detection and/or diagnosis of SARS-CoV-2 by FDA under an Emergency Use Authorization (EUA). This EUA will remain  in effect (meaning this test can be used) for the duration of the COVID-19 declaration under Section 564(b)(1) of the Act, 21 U.S.C.section 360bbb-3(b)(1), unless the authorization is terminated  or revoked sooner.       Influenza A by PCR NEGATIVE NEGATIVE Final   Influenza B by PCR NEGATIVE NEGATIVE Final    Comment: (NOTE) The Xpert Xpress SARS-CoV-2/FLU/RSV plus assay is intended as an aid in the diagnosis of influenza from Nasopharyngeal swab specimens and should not be used as a sole basis for treatment. Nasal washings and aspirates are unacceptable for Xpert Xpress SARS-CoV-2/FLU/RSV testing.  Fact Sheet for Patients: BloggerCourse.com  Fact Sheet for Healthcare Providers: SeriousBroker.it  This test is not yet approved or cleared by the [place] FDA and has been authorized for detection and/or diagnosis of SARS-CoV-2 by FDA under an Emergency Use Authorization (EUA). This EUA will remain in effect (meaning this test can be used) for the duration of the COVID-19 declaration under Section 564(b)(1) of the Act, 21 U.S.C. section 360bbb-3(b)(1),  unless the authorization is terminated or revoked.  Performed at [facility], [address] [place], [place] [address]     Time coordinating discharge: Over 30 minutes  SIGNED:  Arnetha Courser, MD  [facility] 06/14/2021, 10:57 AM  If 7PM-7AM, please contact night-coverage [email or web address]  This record has been created using Conservation officer, historic buildings. Errors have been sought and corrected,but may not always be located. Such creation errors do not reflect on the standard of care.

## 2021-06-14 NOTE — Progress Notes (Signed)
Pt being discharged, pt with no complaints,awaiting transport for discharge

## 2021-06-14 NOTE — TOC Progression Note (Signed)
Transition of Care [facility]) - Progression Note    Patient Details  Name: Darrell Collier MRN: [id number] Date of Birth: Mar 20, 1951  Transition of Care [facility] CM/SW Contact  Allayne Butcher, RN Phone Number: 06/14/2021, 9:42 AM  Clinical Narrative:    Monia Pouch has approved patient for SNF.  Plan for discharge today, patient needs repeat COVID and booster shot before he goes.    Expected Discharge Plan: Skilled Nursing Facility Barriers to Discharge: Insurance Authorization  Expected Discharge Plan and Services Expected Discharge Plan: Skilled Nursing Facility   Discharge Planning Services: CM Consult Post Acute Care Choice: Skilled Nursing Facility Living arrangements for the past 2 months: Single Family Home Expected Discharge Date: 06/10/21               DME Arranged: N/A DME Agency: NA                   Social Determinants of Health (SDOH) Interventions    Readmission Risk Interventions No flowsheet data found.

## 2021-06-14 NOTE — Progress Notes (Signed)
Physical Therapy Treatment Patient Details Name: Darrell Collier MRN: [id number] DOB: 1951/05/06 Today's Date: 06/14/2021    History of Present Illness Pt is a 70 y.o. M presenting to ED during episode of a-fib RVR w/ MRI showing Acute infarct within the posterior left MCA territory.    PT Comments    Pt received seated EOB. Reporting he is back to his normal mobility. Voices that he does not need PT coming to his house as he can complete everything he needs to at home. Continuing PT POC performing dynamic, high level balance activities. Overall pt relies on supervision for changes in gait speed, head turns, and tandem walking. Most difficulty noted with tandem walking with reliance on ankle/hip strategy to correct. Pt able to accelerate gait speed in hallway to point of almost slow jog with no difficulty. Pt returned to room seated EOB. Conversation on possible OP PT referral if pt declining HH PT in case pt wishes to f/u on balance training in OP setting. At this time pt believes his biggest impairment is not balance/mobility but the cognitive impairment from his CVA. Pt displays no acute PT needs at this point in time so PT will sign off on current orders. All goals met. Please contact for new orders if change in medical status occurs.    Follow Up Recommendations  Outpatient PT;Supervision/Assistance - 24 hour;Other (comment) (OP speech referral d/t aphasia)     Equipment Recommendations  None recommended by PT    Recommendations for Other Services       Precautions / Restrictions Precautions Precautions: Fall Restrictions Weight Bearing Restrictions: No    Mobility  Bed Mobility               General bed mobility comments: Pt seated at EOB beginning and end of PT session    Transfers Overall transfer level: Needs assistance Equipment used: None Transfers: Sit to/from Stand Sit to Stand: Supervision         General transfer comment: supervision for safety and  environmental awareness  Ambulation/Gait Ambulation/Gait assistance: Supervision Gait Distance (Feet): 320 Feet Assistive device: None Gait Pattern/deviations: Step-through pattern     General Gait Details: Ability to significantly increase spede almost to brief jog in hallway with no issues or LOB. Intermittent difficulty following single step commands without visual cuing   Stairs         General stair comments: Reports competent with stairs and no need to assess/perform again   Wheelchair Mobility    Modified Rankin (Stroke Patients Only)       Balance Overall balance assessment: Independent Sitting-balance support: Feet supported;No upper extremity supported Sitting balance-Leahy Scale: Normal     Standing balance support: During functional activity;No upper extremity supported Standing balance-Leahy Scale: Good Standing balance comment: Able to stand without extremity support             High level balance activites: Head turns;Sudden stops High Level Balance Comments: Minor difficutly with tandem walking with ankle/hip strategy to correct. minguard provided            Cognition Arousal/Alertness: Awake/alert Behavior During Therapy: WFL for tasks assessed/performed Overall Cognitive Status: Difficult to assess                                        Exercises Other Exercises Other Exercises: Tandem walking in room    General Comments  Pertinent Vitals/Pain Pain Assessment: No/denies pain Faces Pain Scale: No hurt    Home Living                      Prior Function            PT Goals (current goals can now be found in the care plan section) Acute Rehab PT Goals Patient Stated Goal: to go home PT Goal Formulation: With patient Time For Goal Achievement: 06/22/21 Potential to Achieve Goals: Good Progress towards PT goals: Goals met/education completed, patient discharged from PT    Frequency    Min  2X/week      PT Plan Discharge plan needs to be updated    Co-evaluation              AM-PAC PT "6 Clicks" Mobility   Outcome Measure  Help needed turning from your back to your side while in a flat bed without using bedrails?: None Help needed moving from lying on your back to sitting on the side of a flat bed without using bedrails?: None Help needed moving to and from a bed to a chair (including a wheelchair)?: None Help needed standing up from a chair using your arms (e.g., wheelchair or bedside chair)?: None Help needed to walk in hospital room?: A Little Help needed climbing 3-5 steps with a railing? : A Little 6 Click Score: 22    End of Session Equipment Utilized During Treatment: Gait belt Activity Tolerance: Patient tolerated treatment well Patient left: in bed;with call bell/phone within reach Nurse Communication: Mobility status PT Visit Diagnosis: Unsteadiness on feet (R26.81);Other abnormalities of gait and mobility (R26.89);Other symptoms and signs involving the nervous system (R29.898)     Time: 3710-6269 PT Time Calculation (min) (ACUTE ONLY): 20 min  Charges:  $Neuromuscular Re-education: 8-22 mins                     Salem Caster. Fairly IV, PT, DPT Physical Therapist- [facility]  06/14/2021, 10:26 AM

## 2021-06-27 DIAGNOSIS — Z8673 Personal history of transient ischemic attack (TIA), and cerebral infarction without residual deficits: Secondary | ICD-10-CM | POA: Insufficient documentation

## 2021-06-27 DIAGNOSIS — J431 Panlobular emphysema: Secondary | ICD-10-CM | POA: Insufficient documentation

## 2021-06-27 DIAGNOSIS — E782 Mixed hyperlipidemia: Secondary | ICD-10-CM | POA: Insufficient documentation

## 2021-07-02 ENCOUNTER — Other Ambulatory Visit: Payer: Self-pay

## 2021-07-02 ENCOUNTER — Ambulatory Visit: Payer: Medicare HMO | Attending: Internal Medicine | Admitting: Speech Pathology

## 2021-07-02 DIAGNOSIS — I639 Cerebral infarction, unspecified: Secondary | ICD-10-CM | POA: Insufficient documentation

## 2021-07-02 DIAGNOSIS — R4701 Aphasia: Secondary | ICD-10-CM | POA: Diagnosis present
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
## 2021-07-05 ENCOUNTER — Ambulatory Visit: Payer: Medicare HMO | Attending: Internal Medicine | Admitting: Speech Pathology

## 2021-07-05 ENCOUNTER — Other Ambulatory Visit: Payer: Self-pay

## 2021-07-05 DIAGNOSIS — R4701 Aphasia: Secondary | ICD-10-CM | POA: Diagnosis present

## 2021-07-05 DIAGNOSIS — I639 Cerebral infarction, unspecified: Secondary | ICD-10-CM | POA: Diagnosis present
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
## 2021-07-11 ENCOUNTER — Other Ambulatory Visit: Payer: Self-pay

## 2021-07-11 ENCOUNTER — Ambulatory Visit: Payer: Medicare HMO | Admitting: Speech Pathology

## 2021-07-11 DIAGNOSIS — R4701 Aphasia: Secondary | ICD-10-CM | POA: Diagnosis not present

## 2021-07-11 DIAGNOSIS — I639 Cerebral infarction, unspecified: Secondary | ICD-10-CM
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
## 2021-07-17 ENCOUNTER — Other Ambulatory Visit: Payer: Self-pay

## 2021-07-17 ENCOUNTER — Ambulatory Visit: Payer: Medicare HMO | Admitting: Speech Pathology

## 2021-07-17 DIAGNOSIS — I639 Cerebral infarction, unspecified: Secondary | ICD-10-CM

## 2021-07-17 DIAGNOSIS — R4701 Aphasia: Secondary | ICD-10-CM | POA: Diagnosis not present
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
## 2021-07-19 ENCOUNTER — Other Ambulatory Visit: Payer: Self-pay

## 2021-07-19 ENCOUNTER — Ambulatory Visit: Payer: Medicare HMO | Admitting: Speech Pathology

## 2021-07-19 DIAGNOSIS — R4701 Aphasia: Secondary | ICD-10-CM | POA: Diagnosis not present

## 2021-07-19 DIAGNOSIS — I639 Cerebral infarction, unspecified: Secondary | ICD-10-CM
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
## 2021-07-19 NOTE — Patient Instructions (Signed)
Tips for Talking with People who have Aphasia  . Say one thing at a time . Don't  rush - slow down, be patient . Talk face to face . Reduce background noise . Relax - be natural . Use pen and paper . Write down key words . Draw diagrams or pictures . Don't pretend you understand . Ask what helps . Recap - check you both understand . Be a partner, not a therapist   Aphasia does not affect intelligence, only language. The person with aphasia can still: make decisions, have opinions, and socialize.   Describing words  What group does it belong to?  What do I use it for?  Where can I find it?  What does it LOOK like?  What other words go with it?  What is the 1st sound of the word?   Many Ways to Communicate  Describe it Write it Draw it Gesture it Use related words  Resources  [facility]ph[employer] [place] with Dr. Jessica Obermeyer at [employer] - email [email or web address]  TalkPath Therapy app by [employer] Virtual Connections on [employer] website National Aphasia Association - naa.aphasia.org Aphasia Recovery Connection - aphasiarecoveryconnection.org Tactus therapy apps Constant Therapy  Watch: Patience Listening and Communicating with Aphasia Patients on YouTube https://www.youtube.com/watch?v=aPTTjRTmgq0     

## 2021-07-23 ENCOUNTER — Ambulatory Visit: Payer: Medicare HMO | Admitting: Speech Pathology

## 2021-07-25 ENCOUNTER — Other Ambulatory Visit: Payer: Self-pay

## 2021-07-25 ENCOUNTER — Ambulatory Visit: Payer: Medicare HMO | Admitting: Speech Pathology

## 2021-07-25 DIAGNOSIS — I639 Cerebral infarction, unspecified: Secondary | ICD-10-CM

## 2021-07-25 DIAGNOSIS — R4701 Aphasia: Secondary | ICD-10-CM | POA: Diagnosis not present
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
## 2021-07-30 ENCOUNTER — Other Ambulatory Visit: Payer: Self-pay

## 2021-07-30 ENCOUNTER — Ambulatory Visit: Payer: Medicare HMO | Admitting: Speech Pathology

## 2021-07-30 DIAGNOSIS — R4701 Aphasia: Secondary | ICD-10-CM

## 2021-07-30 DIAGNOSIS — I639 Cerebral infarction, unspecified: Secondary | ICD-10-CM
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
## 2021-08-02 ENCOUNTER — Ambulatory Visit: Payer: Medicare HMO | Admitting: Speech Pathology

## 2021-08-02 ENCOUNTER — Other Ambulatory Visit: Payer: Self-pay

## 2021-08-02 DIAGNOSIS — R4701 Aphasia: Secondary | ICD-10-CM | POA: Diagnosis not present

## 2021-08-02 DIAGNOSIS — I639 Cerebral infarction, unspecified: Secondary | ICD-10-CM
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
## 2021-08-06 ENCOUNTER — Other Ambulatory Visit: Payer: Self-pay

## 2021-08-06 ENCOUNTER — Ambulatory Visit: Payer: Medicare HMO | Attending: Internal Medicine | Admitting: Speech Pathology

## 2021-08-06 DIAGNOSIS — I639 Cerebral infarction, unspecified: Secondary | ICD-10-CM | POA: Diagnosis present

## 2021-08-06 DIAGNOSIS — R4701 Aphasia: Secondary | ICD-10-CM
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
## 2021-08-08 ENCOUNTER — Ambulatory Visit: Payer: Medicare HMO | Admitting: Speech Pathology

## 2021-08-13 ENCOUNTER — Ambulatory Visit: Payer: Medicare HMO | Admitting: Speech Pathology

## 2021-08-13 ENCOUNTER — Other Ambulatory Visit: Payer: Self-pay

## 2021-08-13 DIAGNOSIS — I639 Cerebral infarction, unspecified: Secondary | ICD-10-CM

## 2021-08-13 DIAGNOSIS — R4701 Aphasia: Secondary | ICD-10-CM
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
## 2021-08-16 ENCOUNTER — Ambulatory Visit: Payer: Medicare HMO | Admitting: Speech Pathology

## 2021-08-16 ENCOUNTER — Other Ambulatory Visit: Payer: Self-pay

## 2021-08-16 DIAGNOSIS — I639 Cerebral infarction, unspecified: Secondary | ICD-10-CM

## 2021-08-16 DIAGNOSIS — R4701 Aphasia: Secondary | ICD-10-CM
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
## 2021-08-20 ENCOUNTER — Other Ambulatory Visit: Payer: Self-pay

## 2021-08-20 ENCOUNTER — Ambulatory Visit: Payer: Medicare HMO | Admitting: Speech Pathology

## 2021-08-20 DIAGNOSIS — R4701 Aphasia: Secondary | ICD-10-CM

## 2021-08-20 DIAGNOSIS — I639 Cerebral infarction, unspecified: Secondary | ICD-10-CM
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
## 2021-08-21 ENCOUNTER — Encounter (INDEPENDENT_AMBULATORY_CARE_PROVIDER_SITE_OTHER): Payer: Self-pay | Admitting: Vascular Surgery

## 2021-08-21 ENCOUNTER — Ambulatory Visit (INDEPENDENT_AMBULATORY_CARE_PROVIDER_SITE_OTHER): Payer: Medicare HMO

## 2021-08-21 ENCOUNTER — Ambulatory Visit (INDEPENDENT_AMBULATORY_CARE_PROVIDER_SITE_OTHER): Payer: Medicare HMO | Admitting: Vascular Surgery

## 2021-08-21 VITALS — BP 133/90 | HR 116 | Ht 72.0 in | Wt 210.0 lb

## 2021-08-21 DIAGNOSIS — I10 Essential (primary) hypertension: Secondary | ICD-10-CM

## 2021-08-21 DIAGNOSIS — I4891 Unspecified atrial fibrillation: Secondary | ICD-10-CM

## 2021-08-21 DIAGNOSIS — I70261 Atherosclerosis of native arteries of extremities with gangrene, right leg: Secondary | ICD-10-CM | POA: Diagnosis not present
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
## 2021-08-21 NOTE — Assessment & Plan Note (Signed)
His ABIs today are 1.34 on the right and 1.24 on the left with triphasic waveforms bilaterally. Continue medical management as current. Recheck in one year.

## 2021-08-23 ENCOUNTER — Other Ambulatory Visit: Payer: Self-pay

## 2021-08-23 ENCOUNTER — Ambulatory Visit: Payer: Medicare HMO | Admitting: Speech Pathology

## 2021-08-23 DIAGNOSIS — R4701 Aphasia: Secondary | ICD-10-CM | POA: Diagnosis not present

## 2021-08-23 DIAGNOSIS — I639 Cerebral infarction, unspecified: Secondary | ICD-10-CM
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
## 2021-08-27 ENCOUNTER — Ambulatory Visit: Payer: Medicare HMO | Admitting: Speech Pathology

## 2021-08-29 ENCOUNTER — Ambulatory Visit: Payer: Medicare HMO | Admitting: Speech Pathology

## 2021-08-29 ENCOUNTER — Other Ambulatory Visit: Payer: Self-pay

## 2021-08-29 DIAGNOSIS — I639 Cerebral infarction, unspecified: Secondary | ICD-10-CM

## 2021-08-29 DIAGNOSIS — R4701 Aphasia: Secondary | ICD-10-CM
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
## 2021-09-03 ENCOUNTER — Other Ambulatory Visit: Payer: Self-pay

## 2021-09-03 ENCOUNTER — Ambulatory Visit: Payer: Medicare HMO | Admitting: Speech Pathology

## 2021-09-03 DIAGNOSIS — I639 Cerebral infarction, unspecified: Secondary | ICD-10-CM

## 2021-09-03 DIAGNOSIS — R4701 Aphasia: Secondary | ICD-10-CM
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
## 2021-09-06 ENCOUNTER — Ambulatory Visit: Payer: Medicare HMO | Attending: Internal Medicine | Admitting: Speech Pathology

## 2021-09-06 ENCOUNTER — Other Ambulatory Visit: Payer: Self-pay

## 2021-09-06 DIAGNOSIS — I639 Cerebral infarction, unspecified: Secondary | ICD-10-CM | POA: Diagnosis present

## 2021-09-06 DIAGNOSIS — R4701 Aphasia: Secondary | ICD-10-CM | POA: Diagnosis not present
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
## 2021-09-10 ENCOUNTER — Ambulatory Visit: Payer: Medicare HMO | Admitting: Speech Pathology

## 2021-09-10 ENCOUNTER — Other Ambulatory Visit: Payer: Self-pay

## 2021-09-10 DIAGNOSIS — R4701 Aphasia: Secondary | ICD-10-CM

## 2021-09-10 DIAGNOSIS — I639 Cerebral infarction, unspecified: Secondary | ICD-10-CM
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
## 2021-09-13 ENCOUNTER — Ambulatory Visit: Payer: Medicare HMO | Admitting: Speech Pathology

## 2021-09-13 ENCOUNTER — Other Ambulatory Visit: Payer: Self-pay

## 2021-09-13 DIAGNOSIS — R4701 Aphasia: Secondary | ICD-10-CM

## 2021-09-13 DIAGNOSIS — I639 Cerebral infarction, unspecified: Secondary | ICD-10-CM
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
## 2021-09-17 ENCOUNTER — Ambulatory Visit: Payer: Medicare HMO | Admitting: Speech Pathology

## 2021-09-20 ENCOUNTER — Ambulatory Visit: Payer: Medicare HMO | Admitting: Speech Pathology

## 2021-09-20 ENCOUNTER — Other Ambulatory Visit: Payer: Self-pay

## 2021-09-20 DIAGNOSIS — R4701 Aphasia: Secondary | ICD-10-CM

## 2021-09-20 DIAGNOSIS — I639 Cerebral infarction, unspecified: Secondary | ICD-10-CM
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
## 2021-09-24 ENCOUNTER — Ambulatory Visit: Payer: Medicare HMO | Admitting: Speech Pathology

## 2021-09-26 ENCOUNTER — Other Ambulatory Visit: Payer: Self-pay

## 2021-09-26 ENCOUNTER — Ambulatory Visit: Payer: Medicare HMO | Admitting: Speech Pathology

## 2021-09-26 DIAGNOSIS — I639 Cerebral infarction, unspecified: Secondary | ICD-10-CM

## 2021-09-26 DIAGNOSIS — R4701 Aphasia: Secondary | ICD-10-CM
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
## 2021-10-01 ENCOUNTER — Other Ambulatory Visit: Payer: Self-pay

## 2021-10-01 ENCOUNTER — Ambulatory Visit: Payer: Medicare HMO | Admitting: Speech Pathology

## 2021-10-01 DIAGNOSIS — R4701 Aphasia: Secondary | ICD-10-CM

## 2021-10-01 DIAGNOSIS — I639 Cerebral infarction, unspecified: Secondary | ICD-10-CM
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
## 2021-10-04 ENCOUNTER — Other Ambulatory Visit: Payer: Self-pay

## 2021-10-04 ENCOUNTER — Ambulatory Visit: Payer: Medicare HMO | Attending: Internal Medicine | Admitting: Speech Pathology

## 2021-10-04 DIAGNOSIS — R4701 Aphasia: Secondary | ICD-10-CM | POA: Diagnosis present

## 2021-10-04 DIAGNOSIS — I639 Cerebral infarction, unspecified: Secondary | ICD-10-CM | POA: Diagnosis present
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
## 2021-10-08 ENCOUNTER — Ambulatory Visit: Payer: Medicare HMO | Admitting: Speech Pathology

## 2021-10-08 ENCOUNTER — Other Ambulatory Visit: Payer: Self-pay

## 2021-10-08 DIAGNOSIS — I639 Cerebral infarction, unspecified: Secondary | ICD-10-CM

## 2021-10-08 DIAGNOSIS — R4701 Aphasia: Secondary | ICD-10-CM
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
## 2021-10-11 ENCOUNTER — Ambulatory Visit: Payer: Medicare HMO | Admitting: Speech Pathology

## 2021-10-11 ENCOUNTER — Other Ambulatory Visit: Payer: Self-pay

## 2021-10-11 DIAGNOSIS — I639 Cerebral infarction, unspecified: Secondary | ICD-10-CM

## 2021-10-11 DIAGNOSIS — R4701 Aphasia: Secondary | ICD-10-CM | POA: Diagnosis not present
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
## 2021-10-15 ENCOUNTER — Ambulatory Visit: Payer: Medicare HMO | Admitting: Speech Pathology

## 2021-10-15 ENCOUNTER — Other Ambulatory Visit: Payer: Self-pay

## 2021-10-15 DIAGNOSIS — R4701 Aphasia: Secondary | ICD-10-CM

## 2021-10-15 DIAGNOSIS — I639 Cerebral infarction, unspecified: Secondary | ICD-10-CM
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
## 2021-10-18 ENCOUNTER — Other Ambulatory Visit: Payer: Self-pay

## 2021-10-18 ENCOUNTER — Ambulatory Visit: Payer: Medicare HMO | Admitting: Speech Pathology

## 2021-10-18 DIAGNOSIS — R4701 Aphasia: Secondary | ICD-10-CM

## 2021-10-18 DIAGNOSIS — I639 Cerebral infarction, unspecified: Secondary | ICD-10-CM
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
## 2021-10-22 ENCOUNTER — Ambulatory Visit: Payer: Medicare HMO | Admitting: Speech Pathology

## 2021-10-25 ENCOUNTER — Other Ambulatory Visit: Payer: Self-pay

## 2021-10-25 ENCOUNTER — Ambulatory Visit: Payer: Medicare HMO | Admitting: Speech Pathology

## 2021-10-25 DIAGNOSIS — I639 Cerebral infarction, unspecified: Secondary | ICD-10-CM

## 2021-10-25 DIAGNOSIS — R4701 Aphasia: Secondary | ICD-10-CM | POA: Diagnosis not present
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
## 2021-11-01 ENCOUNTER — Ambulatory Visit: Payer: Medicare HMO | Admitting: Speech Pathology

## 2021-11-01 ENCOUNTER — Other Ambulatory Visit: Payer: Self-pay

## 2021-11-01 DIAGNOSIS — I639 Cerebral infarction, unspecified: Secondary | ICD-10-CM

## 2021-11-01 DIAGNOSIS — R4701 Aphasia: Secondary | ICD-10-CM | POA: Diagnosis not present
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
## 2021-11-06 ENCOUNTER — Ambulatory Visit: Payer: Medicare HMO | Admitting: Speech Pathology

## 2021-11-08 ENCOUNTER — Ambulatory Visit: Payer: Medicare HMO | Attending: Internal Medicine | Admitting: Speech Pathology

## 2021-11-08 ENCOUNTER — Other Ambulatory Visit: Payer: Self-pay

## 2021-11-08 DIAGNOSIS — R4701 Aphasia: Secondary | ICD-10-CM | POA: Diagnosis present

## 2021-11-08 DIAGNOSIS — I639 Cerebral infarction, unspecified: Secondary | ICD-10-CM | POA: Diagnosis present
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
## 2021-11-15 ENCOUNTER — Ambulatory Visit: Payer: Medicare HMO | Admitting: Speech Pathology

## 2021-11-21 ENCOUNTER — Ambulatory Visit: Payer: Medicare HMO | Admitting: Speech Pathology

## 2021-11-21 ENCOUNTER — Other Ambulatory Visit: Payer: Self-pay

## 2021-11-21 DIAGNOSIS — R4701 Aphasia: Secondary | ICD-10-CM

## 2021-11-21 DIAGNOSIS — I639 Cerebral infarction, unspecified: Secondary | ICD-10-CM
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
## 2021-11-29 ENCOUNTER — Encounter: Payer: Medicare HMO | Admitting: Speech Pathology

## 2021-12-04 ENCOUNTER — Encounter: Payer: Medicare HMO | Admitting: Speech Pathology

## 2021-12-06 ENCOUNTER — Encounter: Payer: Medicare HMO | Admitting: Speech Pathology

## 2021-12-13 ENCOUNTER — Encounter: Payer: Medicare HMO | Admitting: Speech Pathology

## 2021-12-27 ENCOUNTER — Encounter: Payer: Medicare HMO | Admitting: Speech Pathology

## 2022-01-03 ENCOUNTER — Encounter: Payer: Medicare HMO | Admitting: Speech Pathology

## 2022-01-10 ENCOUNTER — Encounter: Payer: Medicare HMO | Admitting: Speech Pathology

## 2022-01-17 ENCOUNTER — Encounter: Payer: Medicare HMO | Admitting: Speech Pathology

## 2022-01-24 ENCOUNTER — Encounter: Payer: Medicare HMO | Admitting: Speech Pathology

## 2022-01-31 ENCOUNTER — Encounter: Payer: Medicare HMO | Admitting: Speech Pathology

## 2022-08-22 ENCOUNTER — Other Ambulatory Visit (INDEPENDENT_AMBULATORY_CARE_PROVIDER_SITE_OTHER): Payer: Self-pay | Admitting: Vascular Surgery

## 2022-08-22 DIAGNOSIS — Z9889 Other specified postprocedural states: Secondary | ICD-10-CM

## 2022-08-23 ENCOUNTER — Ambulatory Visit (INDEPENDENT_AMBULATORY_CARE_PROVIDER_SITE_OTHER): Payer: Medicare HMO | Admitting: Vascular Surgery

## 2022-08-23 ENCOUNTER — Encounter (INDEPENDENT_AMBULATORY_CARE_PROVIDER_SITE_OTHER): Payer: Medicare HMO

## 2022-08-23 ENCOUNTER — Encounter (INDEPENDENT_AMBULATORY_CARE_PROVIDER_SITE_OTHER): Payer: Self-pay

## 2023-02-28 IMAGING — MR MR HEAD W/O CM
12 series · 47 of 48 positions shown · non-contrast
Comparison: None.

CLINICAL DATA: Acute neurologic deficit

EXAM:
MRI HEAD WITHOUT CONTRAST
TECHNIQUE: Multiplanar, multiecho pulse sequences of the brain and surrounding
structures were obtained without intravenous contrast.

[Series 5: ax dwi_tracew · axial · 3.0mm · 0.65mm/px · z∈[-69,+85]mm · 4 of 48 slices shown]
[im 1/48]
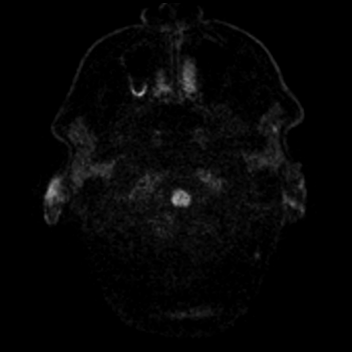
[im 16/48]
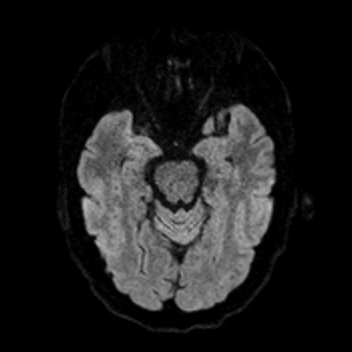
[im 32/48]
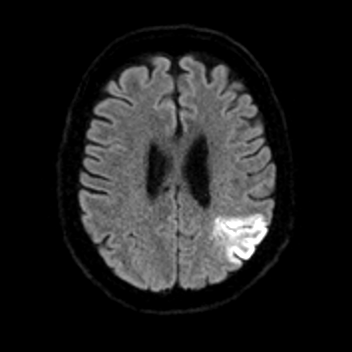
[im 48/48]
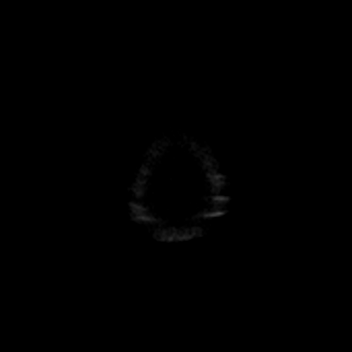

[Series 6: ax dwi_adc · axial · 3.0mm · 0.65mm/px · z∈[-69,+85]mm · 3 of 48 slices shown]
[im 1/48]
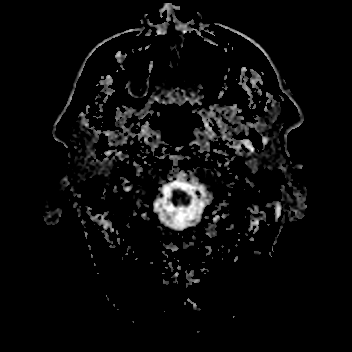
[im 24/48]
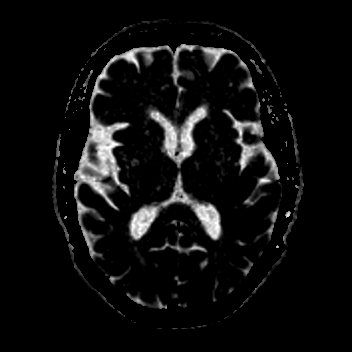
[im 48/48]
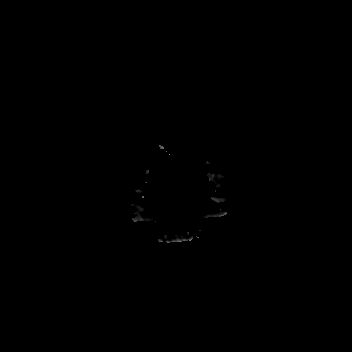

[Series 7: cor dwi_tracew · coronal · 5.0mm · 0.60mm/px · 3 of 38 slices shown]
[im 1/38]
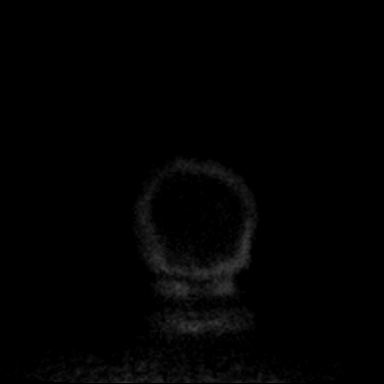
[im 19/38]
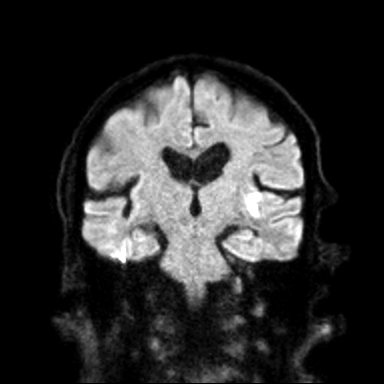
[im 38/38]
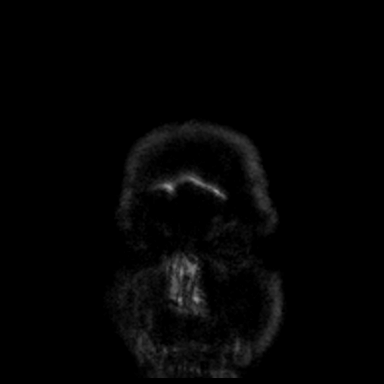

[Series 8: cor dwi_adc · coronal · 5.0mm · 0.60mm/px · 3 of 38 slices shown]
[im 1/38]
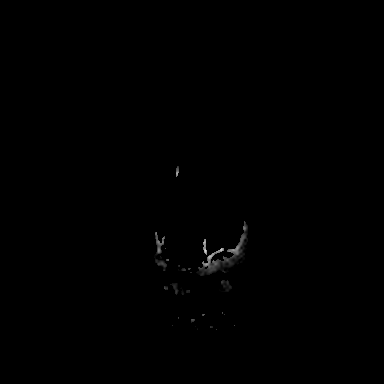
[im 19/38]
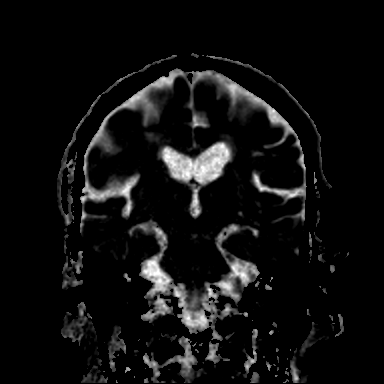
[im 38/38]
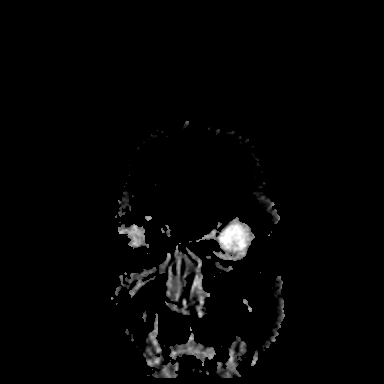

[Series 9: T1 · sagittal · 5.0mm · 0.62mm/px · 2 of 25 slices shown (1 of 2)]
[im 1/25]
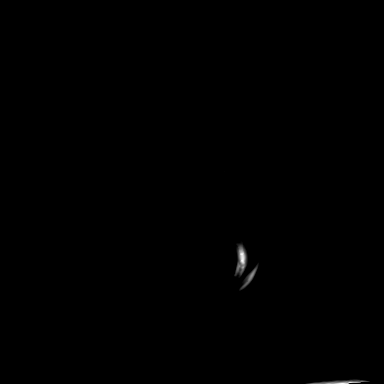
[im 25/25]
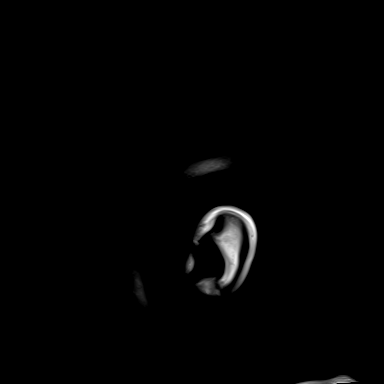

[Series 10: T2 · axial · 5.0mm · 0.53mm/px · z∈[-76,+91]mm · 2 of 29 slices shown (1 of 2)]
[im 1/29]
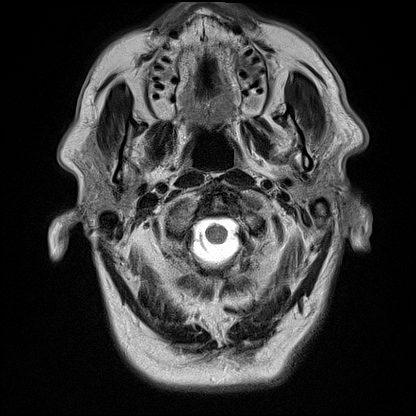
[im 29/29]
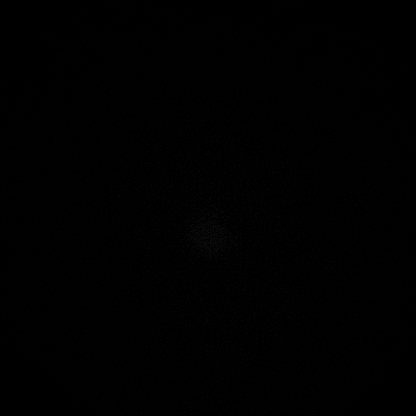

[Series 11: FLAIR · axial · 3.0mm · 0.53mm/px · z∈[-73,+88]mm · 4 of 55 slices shown]
[im 1/55]
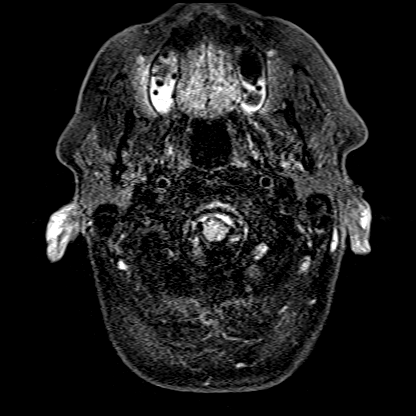
[im 19/55]
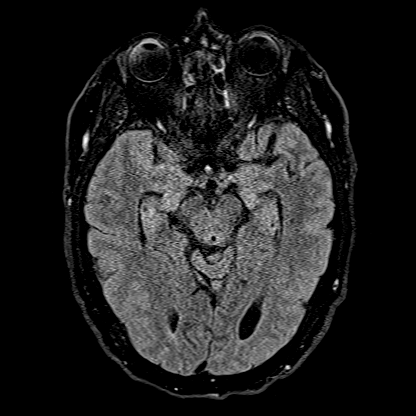
[im 37/55]
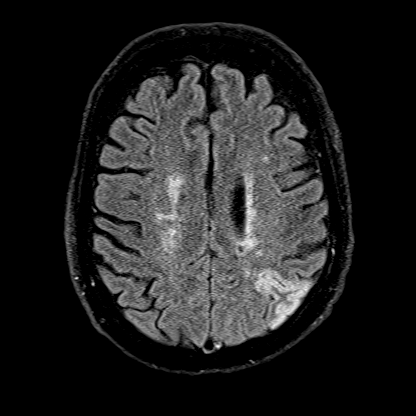
[im 55/55]
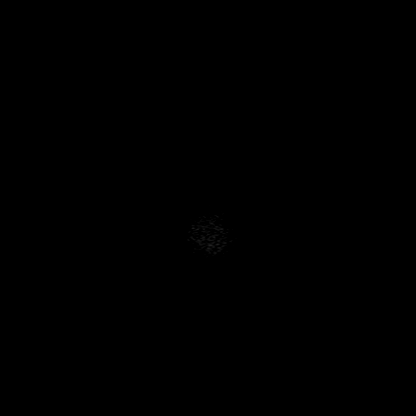

[Series 12: T1 · axial · 1.0mm · 0.98mm/px · z∈[-77,+96]mm · 12 of 175 slices shown (2 of 2)]
[im 1/175]
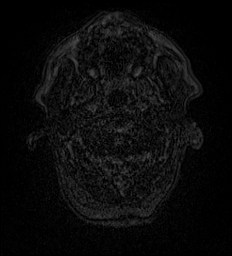
[im 15/175]
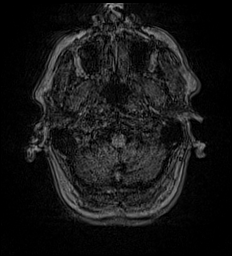
[im 30/175]
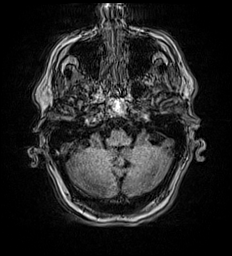
[im 44/175]
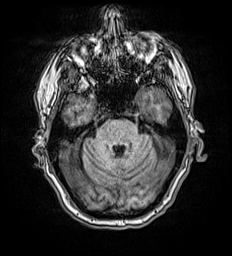
[im 59/175]
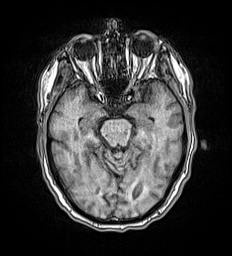
[im 73/175]
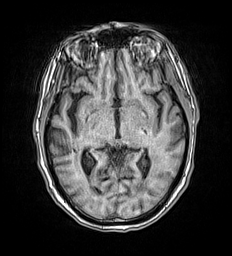
[im 88/175]
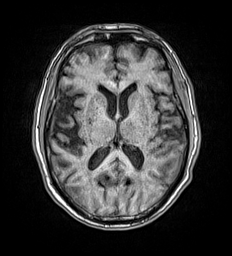
[im 102/175]
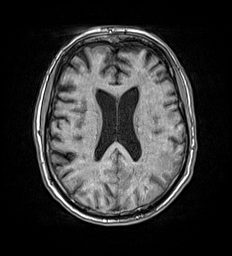
[im 117/175]
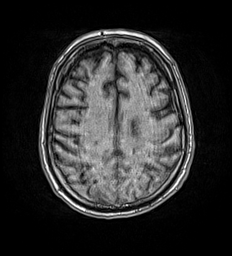
[im 131/175]
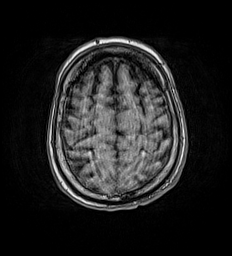
[im 146/175]
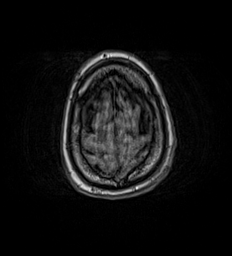
[im 175/175]
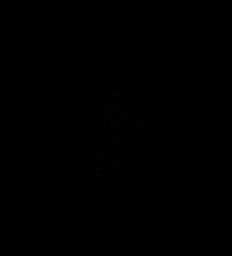

[Series 13: mag_images · axial · 3.0mm · 0.90mm/px · z∈[-79,+96]mm · 4 of 60 slices shown]
[im 1/60]
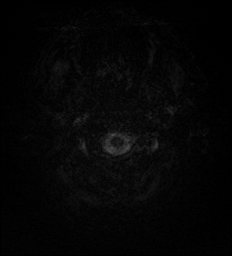
[im 20/60]
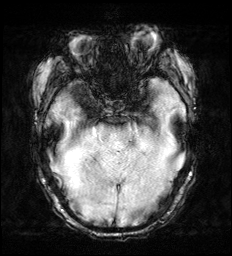
[im 40/60]
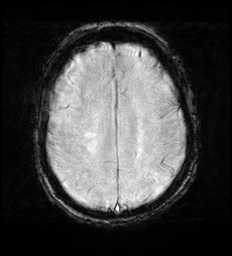
[im 60/60]
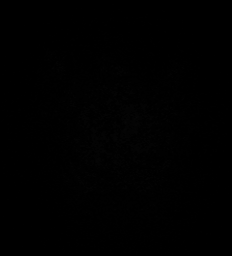

[Series 14: pha_images · axial · 3.0mm · 0.90mm/px · z∈[-79,+90]mm · 4 of 57 slices shown]
[im 1/57]
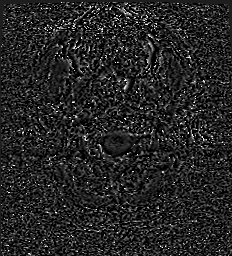
[im 19/57]
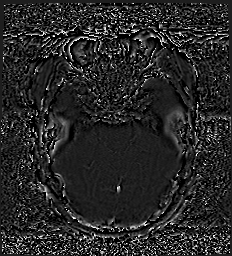
[im 38/57]
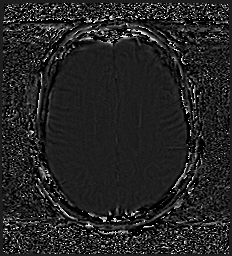
[im 57/57]
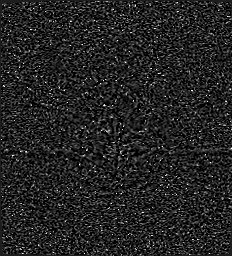

[Series 15: swi_images · axial · 3.0mm · 0.90mm/px · z∈[-79,+96]mm · 4 of 60 slices shown]
[im 1/60]
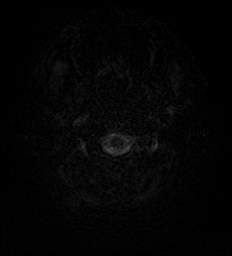
[im 20/60]
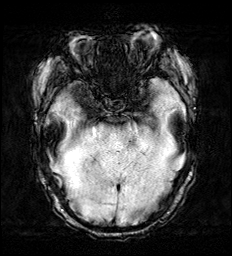
[im 40/60]
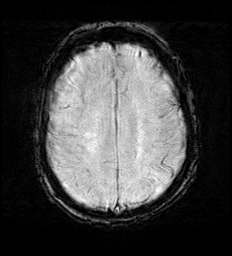
[im 60/60]
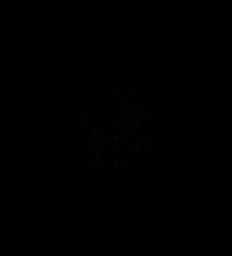

[Series 17: T2 · coronal · 5.0mm · 0.57mm/px · 2 of 32 slices shown (2 of 2)]
[im 1/32]
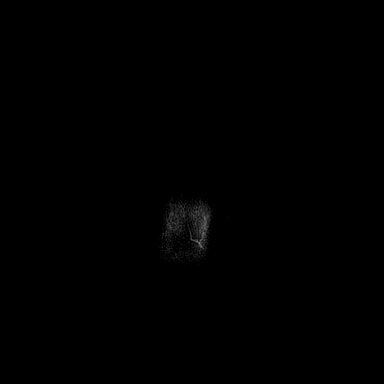
[im 32/32]
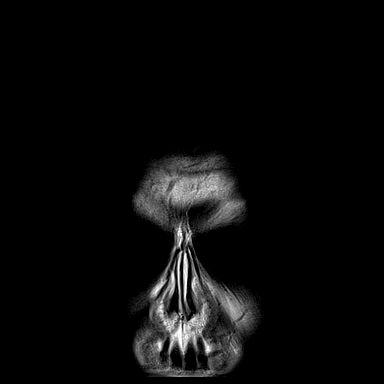

[47 of 48 positions shown; findings below may reference images not displayed]

FINDINGS: Brain: Acute infarct within the posterior left MCA territory.
Multiple falls a of chronic microhemorrhage in the left cerebellum.
There is multifocal hyperintense T2-weighted signal within the white
matter. Generalized volume loss without a clear lobar predilection.
The midline structures are normal. There is an old right cerebellar
infarct.

Vascular: Major flow voids are preserved.

Skull and upper cervical spine: Normal calvarium and skull base.
Visualized upper cervical spine and soft tissues are normal.

Sinuses/Orbits:No paranasal sinus fluid levels or advanced mucosal
thickening. No mastoid or middle ear effusion. Normal orbits.
IMPRESSION: Acute infarct within the posterior left MCA territory without
hemorrhage or mass effect.
